# Patient Record
Sex: Female | Born: 1965 | Race: White | Hispanic: No | Marital: Married | Smoking: Never smoker
Health system: Southern US, Community
[De-identification: ages and names within clinical notes are randomized; demographics above are authoritative.]

## PROBLEM LIST (undated history)

## (undated) DIAGNOSIS — E119 Type 2 diabetes mellitus without complications: Secondary | ICD-10-CM

## (undated) DIAGNOSIS — D649 Anemia, unspecified: Secondary | ICD-10-CM

## (undated) HISTORY — DX: Anemia, unspecified: D64.9

---

## 2014-07-14 ENCOUNTER — Ambulatory Visit: Payer: Self-pay | Admitting: Family Medicine

## 2014-08-11 ENCOUNTER — Ambulatory Visit: Payer: Self-pay | Admitting: Family Medicine

## 2015-06-06 ENCOUNTER — Ambulatory Visit
Admission: EM | Admit: 2015-06-06 | Discharge: 2015-06-06 | Disposition: A | Discharge status: Home / Self Care | Payer: BLUE CROSS/BLUE SHIELD | Attending: Family Medicine | Admitting: Family Medicine

## 2015-06-06 DIAGNOSIS — R05 Cough: Secondary | ICD-10-CM | POA: Diagnosis not present

## 2015-06-06 DIAGNOSIS — J302 Other seasonal allergic rhinitis: Secondary | ICD-10-CM

## 2015-06-06 DIAGNOSIS — R059 Cough, unspecified: Secondary | ICD-10-CM

## 2015-06-06 HISTORY — DX: Type 2 diabetes mellitus without complications: E11.9

## 2015-06-06 MED ORDER — FLUTICASONE PROPIONATE 50 MCG/ACT NA SUSP: 2.0000 | Freq: Every day | NASAL | Status: DC

## 2015-06-06 NOTE — ED Notes (Signed)
Started Thursday with cough which was dry and hacking. Now productive yellow. Cough worse at night

## 2015-06-06 NOTE — Discharge Instructions (Signed)
° °  Let's try  A courseof Loratadine ( Claritin ) 1 tablet daily Fluticasone ( Flonase) 1 spray per nostril per day Increase fluids -water  by mouth plus steamy showers/tub baths  Robitussin DM (Guaifenesin DM) 1 tsp every 4 hours   Allergic Rhinitis Allergic rhinitis is when the mucous membranes in the nose respond to allergens. Allergens are particles in the air that cause your body to have an allergic reaction. This causes you to release allergic antibodies. Through a chain of events, these eventually cause you to release histamine into the blood stream. Although meant to protect the body, it is this release of histamine that causes your discomfort, such as frequent sneezing, congestion, and an itchy, runny nose.  CAUSES  Seasonal allergic rhinitis (hay fever) is caused by pollen allergens that may come from grasses, trees, and weeds. Year-round allergic rhinitis (perennial allergic rhinitis) is caused by allergens such as house dust mites, pet dander, and mold spores.  SYMPTOMS   Nasal stuffiness (congestion).  Itchy, runny nose with sneezing and tearing of the eyes. DIAGNOSIS  Your health care provider can help you determine the allergen or allergens that trigger your symptoms. If you and your health care provider are unable to determine the allergen, skin or blood testing may be used. TREATMENT  Allergic rhinitis does not have a cure, but it can be controlled by:  Medicines and allergy shots (immunotherapy).  Avoiding the allergen. Hay fever may often be treated with antihistamines in pill or nasal spray forms. Antihistamines block the effects of histamine. There are over-the-counter medicines that may help with nasal congestion and swelling around the eyes. Check with your health care provider before taking or giving this medicine.  If avoiding the allergen or the medicine prescribed do not work, there are many new medicines your health care provider can prescribe. Stronger medicine  may be used if initial measures are ineffective. Desensitizing injections can be used if medicine and avoidance does not work. Desensitization is when a patient is given ongoing shots until the body becomes less sensitive to the allergen. Make sure you follow up with your health care provider if problems continue. HOME CARE INSTRUCTIONS It is not possible to completely avoid allergens, but you can reduce your symptoms by taking steps to limit your exposure to them. It helps to know exactly what you are allergic to so that you can avoid your specific triggers. SEEK MEDICAL CARE IF:   You have a fever.  You develop a cough that does not stop easily (persistent).  You have shortness of breath.  You start wheezing.  Symptoms interfere with normal daily activities. Document Released: 07/16/2001 Document Revised: 10/26/2013 Document Reviewed: 06/28/2013 [facility] Patient Information 2015 [place], [place]. This information is not intended to replace advice given to you by your health care provider. Make sure you discuss any questions you have with your health care provider.

## 2015-06-06 NOTE — ED Provider Notes (Signed)
CSN: [id number]     Arrival date & time 06/06/15  1912 History   First MD Initiated Contact with Patient 06/06/15 1938     Chief Complaint  Patient presents with  . Cough   (Consider location/radiation/quality/duration/timing/severity/associated sxs/prior Treatment) HPI 56 F with a few days of shallow non-productive cough. Has had weeks of post nasal drip,  mucous congestion at bedtime with small amount clear mucous in cough. Has itchy ears. Afebrile.  All symptoms have increased in the past 6 days.- there has been a recent increase grass pollen.  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 New wallcoverings and carpets  Past Medical History  Diagnosis Date  . Diabetes mellitus without complication    Past Surgical History  Procedure Laterality Date  . Cesarean section      1993   Family History  Problem Relation Age of Onset  . Cancer Father    History  Substance Use Topics  . Smoking status: Never Smoker   . Smokeless tobacco: Not on file  . Alcohol Use: No   OB History    No data available     Review of Systems   Constitutional -afebrile Eyes-denies visual changes ENT- normal voice,denies sore throat, shallow cough CV-denies chest pain Resp-denies SOB GI- negative for nausea,vomiting, diarrhea GU- negative for dysuria MSK- negative for back pain, ambulatory Skin- denies acute changes Neuro- negative headache,focal weakness or numbness    Allergies  Sulfa antibiotics  Home Medications   Prior to Admission medications   Medication Sig Start Date End Date Taking? Authorizing Provider  atorvastatin (LIPITOR) 40 MG tablet Take 40 mg by mouth daily.   Yes Historical Provider, MD  glimepiride (AMARYL) 4 MG tablet Take 8 mg by mouth daily with breakfast.   Yes Historical Provider, MD  hydrochlorothiazide (HYDRODIURIL) 25 MG tablet Take 25 mg by mouth daily.   Yes Historical Provider, MD  lisinopril (PRINIVIL,ZESTRIL)  2.5 MG tablet Take 2.5 mg by mouth daily.   Yes Historical Provider, MD  sitaGLIPtin-metformin (JANUMET) 50-1000 MG per tablet Take 1 tablet by mouth 2 (two) times daily with a meal.   Yes Historical Provider, MD  fluticasone (FLONASE) 50 MCG/ACT nasal spray Place 2 sprays into both nostrils daily. 06/06/15   Rae Halsted, PA-C   BP 123/61 mmHg  Pulse 98  Temp(Src) 98.9 F (37.2 C) (Tympanic)  Resp 18  Ht  (1.651 m)  Wt 228 lb (103.42 kg)  BMI 37.94 kg/m2  SpO2 97%  LMP 05/31/2015 (Exact Date) Physical Exam    Constitutional -alert and oriented,well appearing and in no acute distress, afebrile Head-atraumatic, normocephalic Eyes- conjunctiva normal, EOMI ,conjugate gaze Nose- mild  nasal congestion , no sinus pressure, no eustachian tube dysfunction Mouth/throat- mucous membranes moist ,oropharynx non-erythematous Neck- supple without glandular enlargement CV- regular rate, grossly normal heart sounds,  Resp-no distress, normal respiratory effort,clear to auscultation bilaterally GI- s,no distention GU- u not examined MSK- no tender, normal ROM, all extremities, ambulatory, self-care Neuro- normal speech and language,  Skin-warm,dry ,intact; no rash noted Psych-mood and affect grossly normal; speech and behavior grossly normal  ED Course  Procedures (including critical care time) Labs Review Labs Reviewed - No data to display  Imaging Review No results found.   MDM   1. Cough   2. Other seasonal allergic rhinitis   Plan: 1.  Diagnosis reviewed with patient-seasonal allergies suspected 2. Rx Claritin, fluticasone ; risks,  benefits, potential side effects reviewed with patient 3. Recommend supportive treatment with OTC guafenesin DM/tylenol/ibuprofen/fluids 4. F/u prn if symptoms worsen or don't improve; viral syndrome can be similar.  Onset of fever, malaise, deep cough with production needs to be responded to an re-evaluated Questions fielded, expectations and  recommendations reviewed.  Patient expresses understanding. Will return to [facility] with questions, concern or exacerbation.       Rae Halsted, PA-C 06/06/15 2037

## 2016-08-23 ENCOUNTER — Other Ambulatory Visit: Payer: Self-pay | Admitting: Family Medicine

## 2016-08-23 DIAGNOSIS — R928 Other abnormal and inconclusive findings on diagnostic imaging of breast: Secondary | ICD-10-CM

## 2016-08-27 ENCOUNTER — Other Ambulatory Visit: Payer: Self-pay | Admitting: Family Medicine

## 2016-08-27 DIAGNOSIS — R928 Other abnormal and inconclusive findings on diagnostic imaging of breast: Secondary | ICD-10-CM

## 2016-09-16 ENCOUNTER — Ambulatory Visit
Admission: RE | Admit: 2016-09-16 | Discharge: 2016-09-16 | Disposition: A | Discharge status: Home / Self Care | Payer: 59 | Source: Ambulatory Visit | Attending: Family Medicine | Admitting: Family Medicine

## 2016-09-16 DIAGNOSIS — R928 Other abnormal and inconclusive findings on diagnostic imaging of breast: Secondary | ICD-10-CM | POA: Diagnosis present

## 2016-11-30 ENCOUNTER — Encounter: Payer: Self-pay | Admitting: Emergency Medicine

## 2016-11-30 ENCOUNTER — Ambulatory Visit
Admission: EM | Admit: 2016-11-30 | Discharge: 2016-11-30 | Disposition: A | Discharge status: Home / Self Care | Payer: 59 | Attending: Family Medicine | Admitting: Family Medicine

## 2016-11-30 DIAGNOSIS — N3001 Acute cystitis with hematuria: Secondary | ICD-10-CM

## 2016-11-30 LAB — URINALYSIS, COMPLETE (UACMP) WITH MICROSCOPIC
Bilirubin Urine: NEGATIVE
GLUCOSE, UA: 500 mg/dL — AB
KETONES UR: 15 mg/dL — AB
Nitrite: NEGATIVE
PROTEIN: 100 mg/dL — AB
Specific Gravity, Urine: 1.015 (ref 1.005–1.030)
pH: 5 (ref 5.0–8.0)

## 2016-11-30 MED ORDER — FLUCONAZOLE 150 MG PO TABS: 150.0000 mg | ORAL_TABLET | Freq: Every day | ORAL | 0 refills | Status: AC

## 2016-11-30 MED ORDER — NITROFURANTOIN MONOHYD MACRO 100 MG PO CAPS: 100.0000 mg | ORAL_CAPSULE | Freq: Two times a day (BID) | ORAL | 0 refills | Status: DC

## 2016-11-30 NOTE — ED Triage Notes (Signed)
Patient c/o burning when urinating and increase in urinary frequency for a week.  

## 2016-11-30 NOTE — ED Provider Notes (Signed)
[facility]    CSN: [id number] Arrival date & time: 11/30/16  5784     History   Chief Complaint Chief Complaint  Patient presents with  . Dysuria    HPI Allison Glover is a 51 y.o. female.    Dysuria  Pain quality:  Burning Onset quality:  Sudden Duration:  6 days Timing:  Constant Progression:  Worsening Chronicity:  New Recent urinary tract infections: no   Relieved by:  Nothing Ineffective treatments:  Cranberry juice Urinary symptoms: discolored urine, frequent urination and hematuria   Associated symptoms: no abdominal pain, no fever, no flank pain, no nausea, no vaginal discharge and no vomiting   Risk factors: no hx of pyelonephritis, no hx of urolithiasis, no kidney transplant, not pregnant, no recurrent urinary tract infections, no renal cysts, no renal disease, no single kidney and no urinary catheter     Past Medical History:  Diagnosis Date  . Diabetes mellitus without complication (HCC)     There are no active problems to display for this patient.   Past Surgical History:  Procedure Laterality Date  . CESAREAN SECTION     1993    OB History    No data available       Home Medications    Prior to Admission medications   Medication Sig Start Date End Date Taking? Authorizing Provider  empagliflozin (JARDIANCE) 25 MG TABS tablet Take 25 mg by mouth daily.   Yes Historical Provider, MD  metFORMIN (GLUMETZA) 500 MG (MOD) 24 hr tablet Take 500 mg by mouth daily with breakfast.   Yes Historical Provider, MD  atorvastatin (LIPITOR) 40 MG tablet Take 40 mg by mouth daily.    Historical Provider, MD  fluconazole (DIFLUCAN) 150 MG tablet Take 1 tablet (150 mg total) by mouth daily. 11/30/16 12/01/16  Payton Mccallum, MD  fluticasone (FLONASE) 50 MCG/ACT nasal spray Place 2 sprays into both nostrils daily. 06/06/15   Rae Halsted, PA-C  glimepiride (AMARYL) 4 MG tablet Take 8 mg by mouth daily with breakfast.    Historical Provider, MD    lisinopril (PRINIVIL,ZESTRIL) 2.5 MG tablet Take 2.5 mg by mouth daily.    Historical Provider, MD  nitrofurantoin, macrocrystal-monohydrate, (MACROBID) 100 MG capsule Take 1 capsule (100 mg total) by mouth 2 (two) times daily. 11/30/16   Payton Mccallum, MD    Family History Family History  Problem Relation Age of Onset  . Cancer Father     Social History Social History  Substance Use Topics  . Smoking status: Never Smoker  . Smokeless tobacco: Never Used  . Alcohol use No     Allergies   Sulfa antibiotics   Review of Systems Review of Systems  Constitutional: Negative for fever.  Gastrointestinal: Negative for abdominal pain, nausea and vomiting.  Genitourinary: Positive for dysuria. Negative for flank pain and vaginal discharge.     Physical Exam Triage Vital Signs ED Triage Vitals  Enc Vitals Group     BP 11/30/16 0845 110/70     Pulse Rate 11/30/16 0845 99     Resp 11/30/16 0845 16     Temp 11/30/16 0845 98.9 F (37.2 C)     Temp Source 11/30/16 0845 Oral     SpO2 11/30/16 0845 100 %     Weight 11/30/16 0843 222 lb (100.7 kg)     Height 11/30/16 0843 5\' 4"  (1.626 m)     Head Circumference --      Peak Flow --  Pain Score 11/30/16 0845 5     Pain Loc --      Pain Edu? --      Excl. in GC? --    No data found.   Updated Vital Signs BP 110/70 (BP Location: Right Arm)   Pulse 99   Temp 98.9 F (37.2 C) (Oral)   Resp 16   Ht 5\' 4"  (1.626 m)   Wt 222 lb (100.7 kg)   LMP 11/18/2016 (Exact Date)   SpO2 100%   BMI 38.11 kg/m   Visual Acuity Right Eye Distance:   Left Eye Distance:   Bilateral Distance:    Right Eye Near:   Left Eye Near:    Bilateral Near:     Physical Exam  Constitutional: She appears well-developed and well-nourished. No distress.  Abdominal: Soft. Bowel sounds are normal. She exhibits no distension and no mass. There is no tenderness. There is no rebound and no guarding.  Skin: She is not diaphoretic.  Nursing note and  vitals reviewed.    UC Treatments / Results  Labs (all labs ordered are listed, but only abnormal results are displayed) Labs Reviewed  URINALYSIS, COMPLETE (UACMP) WITH MICROSCOPIC - Abnormal; Notable for the following:       Result Value   Color, Urine BROWN (*)    APPearance CLOUDY (*)    Glucose, UA 500 (*)    Hgb urine dipstick LARGE (*)    Ketones, ur 15 (*)    Protein, ur 100 (*)    Leukocytes, UA SMALL (*)    Squamous Epithelial / LPF 0-5 (*)    Bacteria, UA FEW (*)    All other components within normal limits  URINE CULTURE    EKG  EKG Interpretation None       Radiology No results found.  Procedures Procedures (including critical care time)  Medications Ordered in UC Medications - No data to display   Initial Impression / Assessment and Plan / UC Course  I have reviewed the triage vital signs and the nursing notes.  Pertinent labs & imaging results that were available during my care of the patient were reviewed by me and considered in my medical decision making (see chart for details).       Final Clinical Impressions(s) / UC Diagnoses   Final diagnoses:  Acute cystitis with hematuria    New Prescriptions Discharge Medication List as of 11/30/2016  9:06 AM    START taking these medications   Details  fluconazole (DIFLUCAN) 150 MG tablet Take 1 tablet (150 mg total) by mouth daily., Starting Sat 11/30/2016, Until Sun 12/01/2016, Normal    nitrofurantoin, macrocrystal-monohydrate, (MACROBID) 100 MG capsule Take 1 capsule (100 mg total) by mouth 2 (two) times daily., Starting Sat 11/30/2016, Normal       1. Lab results and diagnosis reviewed with patient 2. rx as per orders above; reviewed possible side effects, interactions, risks and benefits  3. Recommend supportive treatment with increased water intake 4. Follow-up prn if symptoms worsen or don't improve   Payton Mccallumrlando Angelize Ryce, MD 11/30/16 [phone]

## 2016-12-03 LAB — URINE CULTURE: Culture: >=100000 — AB

## 2019-09-03 ENCOUNTER — Other Ambulatory Visit: Payer: Self-pay | Admitting: Family Medicine

## 2019-09-03 DIAGNOSIS — Z1231 Encounter for screening mammogram for malignant neoplasm of breast: Secondary | ICD-10-CM

## 2019-12-21 ENCOUNTER — Ambulatory Visit
Admission: RE | Admit: 2019-12-21 | Discharge: 2019-12-21 | Disposition: A | Discharge status: Home / Self Care | Payer: 59 | Source: Ambulatory Visit | Attending: Family Medicine | Admitting: Family Medicine

## 2019-12-21 DIAGNOSIS — Z1231 Encounter for screening mammogram for malignant neoplasm of breast: Secondary | ICD-10-CM | POA: Diagnosis present

## 2020-12-25 IMAGING — MG DIGITAL SCREENING BILAT W/ TOMO W/ CAD
6 of 12 series · 6 of 36 positions shown · non-contrast
Comparison: Previous exam(s).

CLINICAL DATA: Screening.

EXAM:
DIGITAL SCREENING BILATERAL MAMMOGRAM WITH TOMO AND CAD

[L CC synth-2D (1 of 2)]
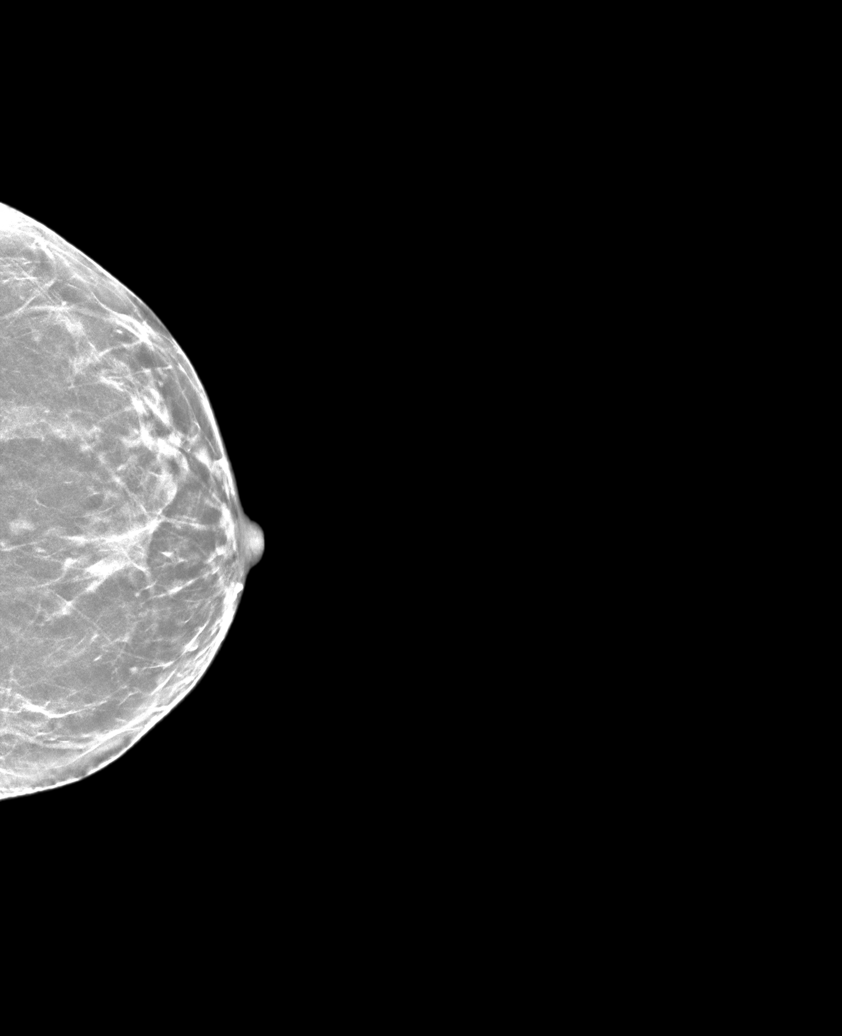

[R MLO synth-2D]
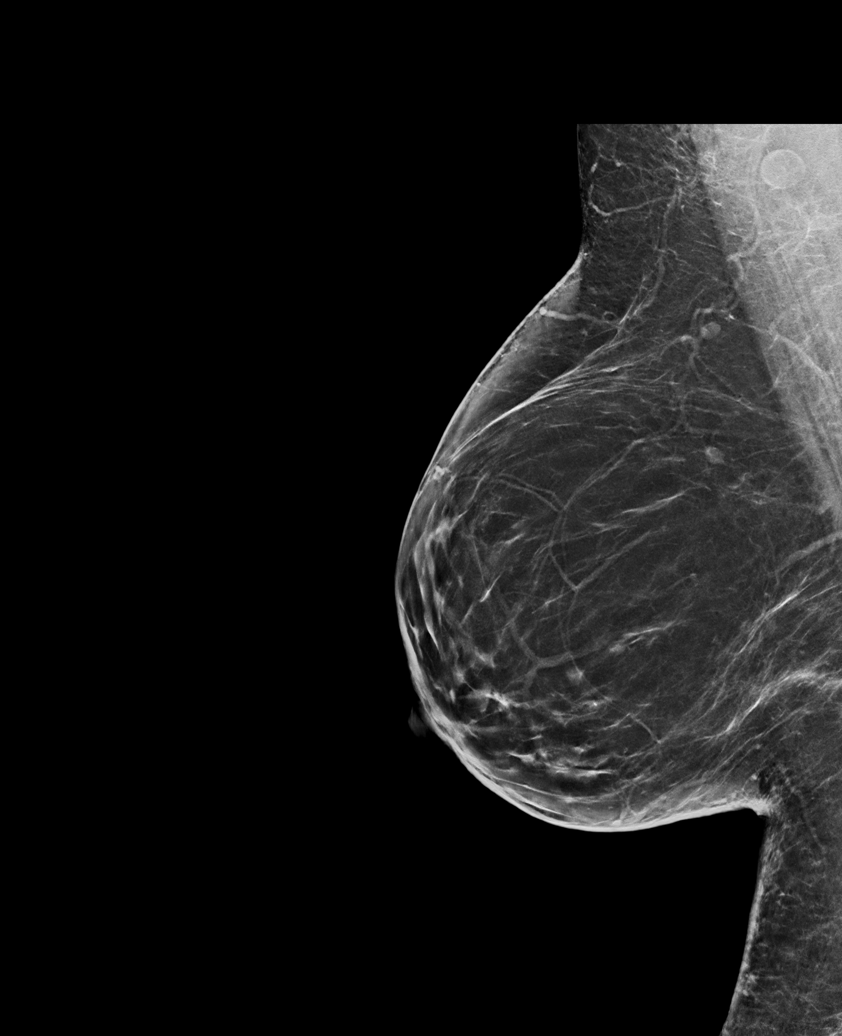

[R CC synth-2D (1 of 2)]
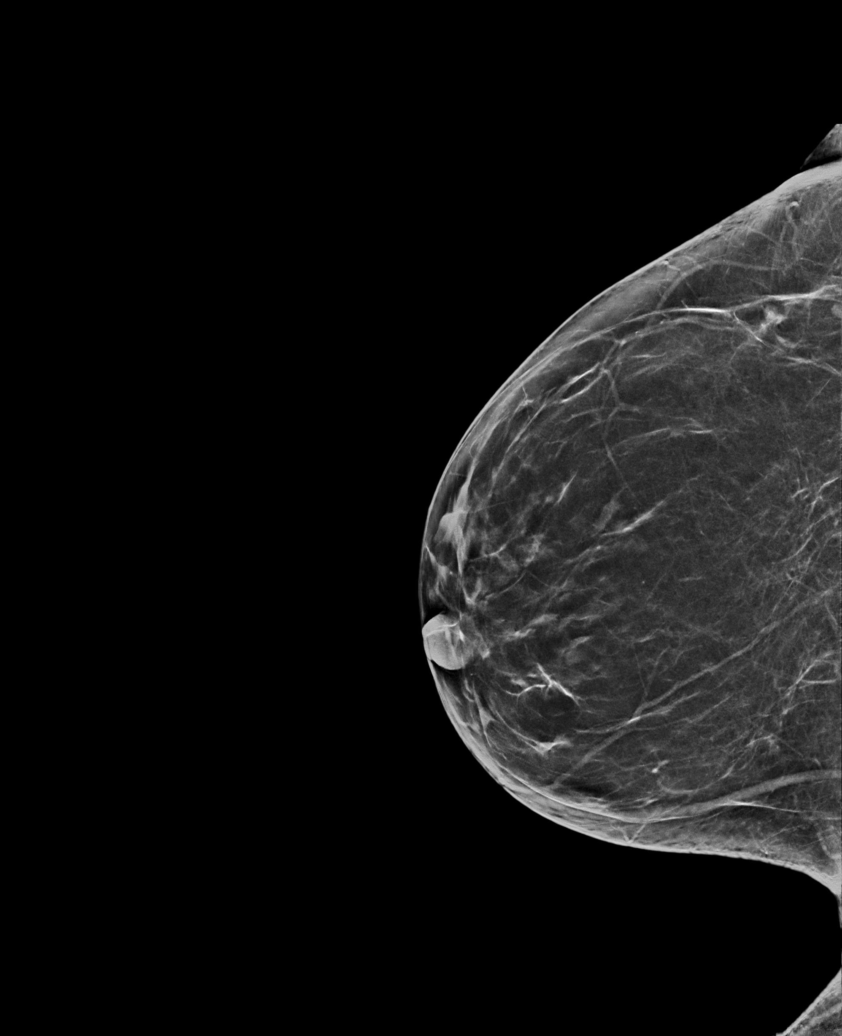

[L CC synth-2D (2 of 2)]
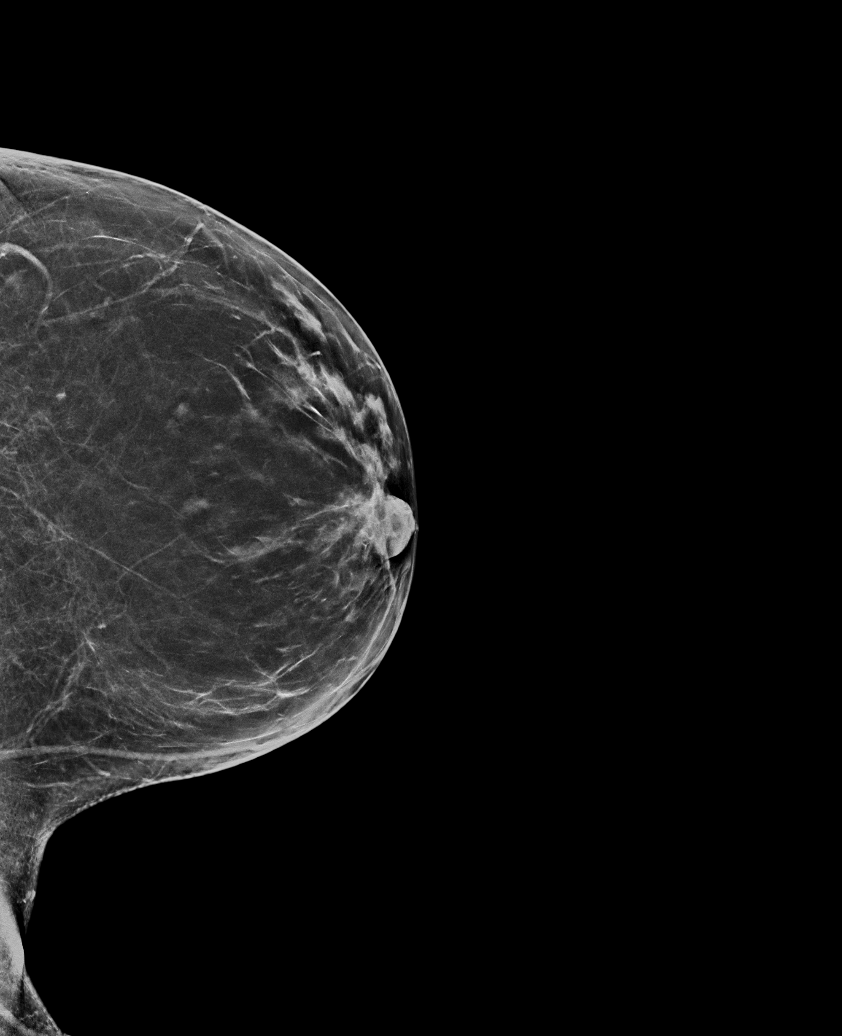

[R CC synth-2D (2 of 2)]
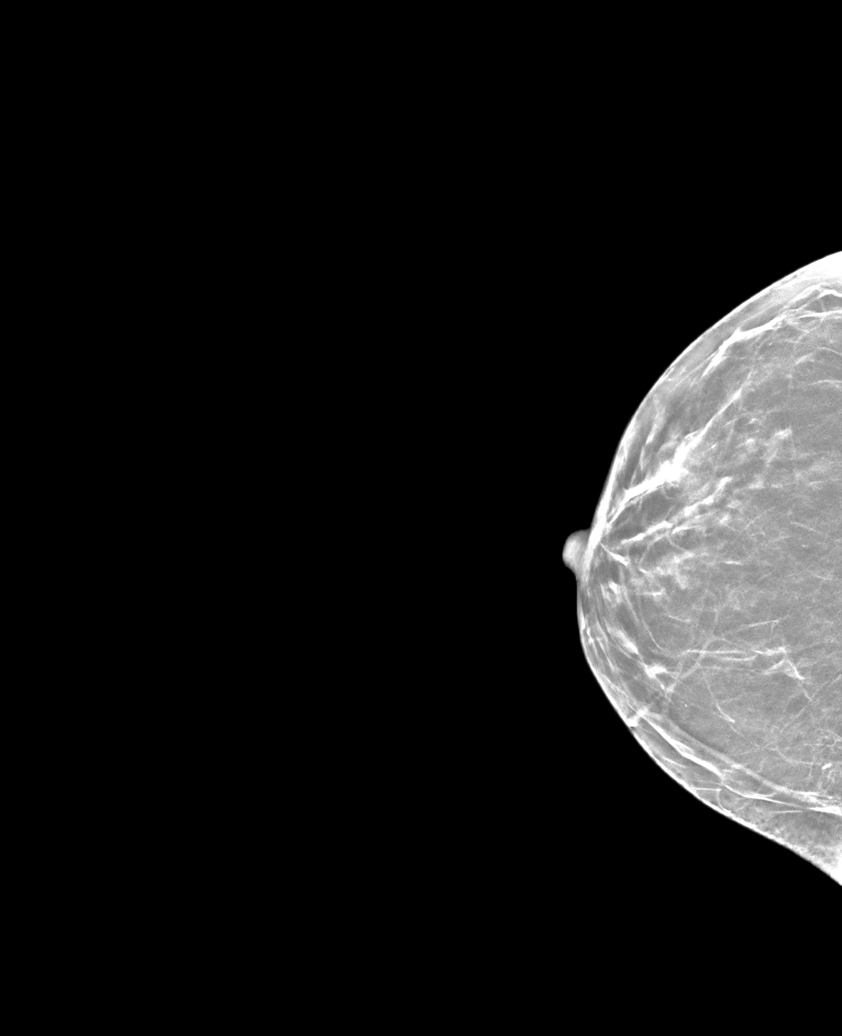

[L MLO synth-2D]
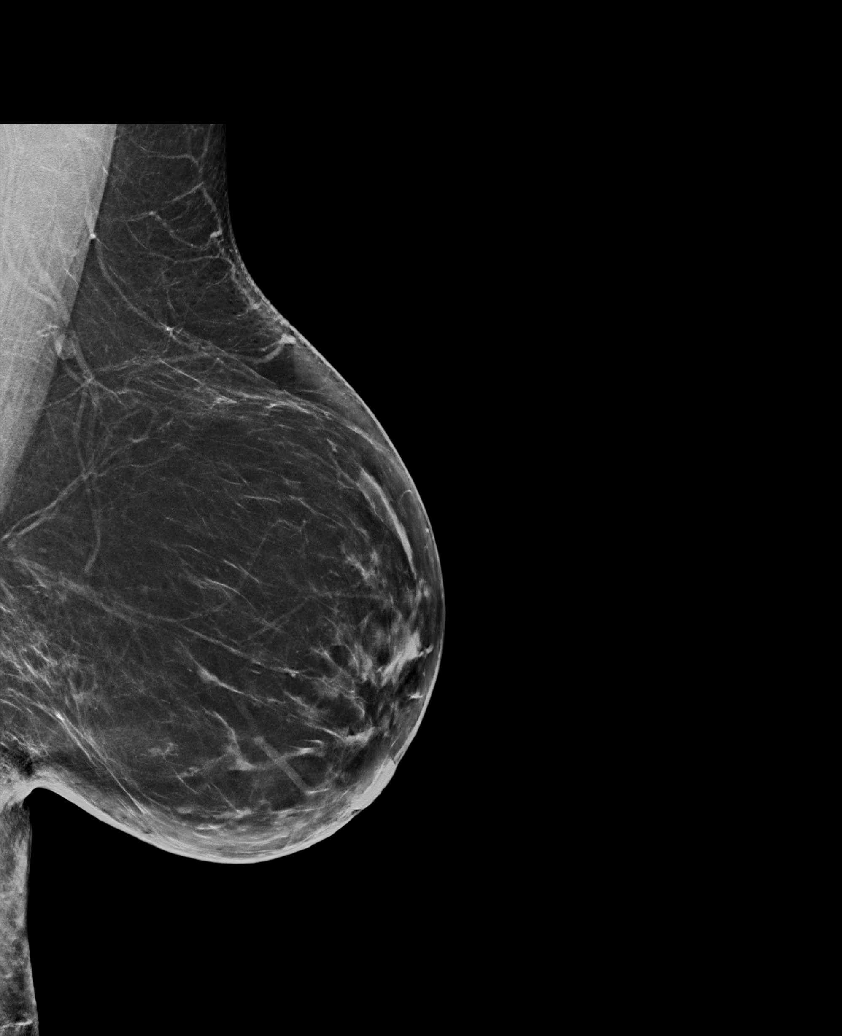

[6 of 36 positions shown; findings below may reference images not displayed]

ACR Breast Density Category b: There are scattered areas of
fibroglandular density.
FINDINGS: There are no findings suspicious for malignancy. Images were
processed with CAD.
IMPRESSION: No mammographic evidence of malignancy. A result letter of this
screening mammogram will be mailed directly to the patient.

RECOMMENDATION:
Screening mammogram in one year. (Code:[id number])

BI-RADS CATEGORY  1: Negative.

## 2022-01-28 LAB — COLOGUARD: COLOGUARD: POSITIVE — AB

## 2022-09-03 ENCOUNTER — Other Ambulatory Visit: Payer: Self-pay | Admitting: Family Medicine

## 2022-09-03 DIAGNOSIS — Z1231 Encounter for screening mammogram for malignant neoplasm of breast: Secondary | ICD-10-CM

## 2022-12-02 ENCOUNTER — Ambulatory Visit
Admission: RE | Admit: 2022-12-02 | Discharge: 2022-12-02 | Disposition: A | Discharge status: Home / Self Care | Payer: 59 | Source: Ambulatory Visit | Attending: Family Medicine | Admitting: Family Medicine

## 2022-12-02 DIAGNOSIS — Z1231 Encounter for screening mammogram for malignant neoplasm of breast: Secondary | ICD-10-CM | POA: Diagnosis present

## 2024-05-12 ENCOUNTER — Inpatient Hospital Stay

## 2024-05-12 ENCOUNTER — Encounter: Payer: Self-pay | Admitting: Internal Medicine

## 2024-05-12 ENCOUNTER — Inpatient Hospital Stay: Attending: Internal Medicine | Admitting: Internal Medicine

## 2024-05-12 VITALS — BP 123/83 | HR 96 | Temp 97.2°F | Resp 20 | Wt 192.1 lb

## 2024-05-12 DIAGNOSIS — D721 Eosinophilia, unspecified: Secondary | ICD-10-CM | POA: Insufficient documentation

## 2024-05-12 LAB — COMPREHENSIVE METABOLIC PANEL WITH GFR
ALT: 21 U/L (ref 0–44)
AST: 22 U/L (ref 15–41)
Albumin: 4.3 g/dL (ref 3.5–5.0)
Alkaline Phosphatase: 67 U/L (ref 38–126)
Anion gap: 9 (ref 5–15)
BUN: 16 mg/dL (ref 6–20)
CO2: 22 mmol/L (ref 22–32)
Calcium: 9.3 mg/dL (ref 8.9–10.3)
Chloride: 105 mmol/L (ref 98–111)
Creatinine, Ser: 0.78 mg/dL (ref 0.44–1.00)
GFR, Estimated: >60 mL/min (ref >60)
Glucose, Bld: 184 mg/dL — ABNORMAL HIGH (ref 70–99)
Potassium: 3.7 mmol/L (ref 3.5–5.1)
Sodium: 136 mmol/L (ref 135–145)
Total Bilirubin: 0.8 mg/dL (ref 0.0–1.2)
Total Protein: 7.6 g/dL (ref 6.5–8.1)

## 2024-05-12 LAB — TECHNOLOGIST SMEAR REVIEW: Plt Morphology: ADEQUATE

## 2024-05-12 LAB — CBC WITH DIFFERENTIAL/PLATELET
Abs Immature Granulocytes: 0.03 K/uL (ref 0.00–0.07)
Basophils Absolute: 0.1 K/uL (ref 0.0–0.1)
Basophils Relative: 1 %
Eosinophils Absolute: 0.6 K/uL — ABNORMAL HIGH (ref 0.0–0.5)
Eosinophils Relative: 7 %
HCT: 41.8 % (ref 36.0–46.0)
Hemoglobin: 14.3 g/dL (ref 12.0–15.0)
Immature Granulocytes: 0 %
Lymphocytes Relative: 39 %
Lymphs Abs: 3.3 K/uL (ref 0.7–4.0)
MCH: 31 pg (ref 26.0–34.0)
MCHC: 34.2 g/dL (ref 30.0–36.0)
MCV: 90.7 fL (ref 80.0–100.0)
Monocytes Absolute: 0.5 K/uL (ref 0.1–1.0)
Monocytes Relative: 6 %
Neutro Abs: 3.9 K/uL (ref 1.7–7.7)
Neutrophils Relative %: 47 %
Platelets: 310 K/uL (ref 150–400)
RBC: 4.61 MIL/uL (ref 3.87–5.11)
RDW: 12.9 % (ref 11.5–15.5)
WBC: 8.4 K/uL (ref 4.0–10.5)
nRBC: 0 % (ref 0.0–0.2)

## 2024-05-12 LAB — VITAMIN B12: Vitamin B-12: 349 pg/mL (ref 180–914)

## 2024-05-12 LAB — C-REACTIVE PROTEIN: CRP: 1.7 mg/dL — ABNORMAL HIGH (ref <1.0)

## 2024-05-12 LAB — LACTATE DEHYDROGENASE: LDH: 97 U/L — ABNORMAL LOW (ref 98–192)

## 2024-05-12 NOTE — Assessment & Plan Note (Addendum)
#   Elevated white count/-predominant eosinophilia. Patient fairly asymptomatic from underlying blood work.  # I I had a long discussion with the patient regarding the multiple etiologies of elevated white count/eosinophilia including but not limited to reactive causes like allergies; parasitic infection; inflammation/autoimmune conditions, medications etc.  However other etiologies include malignancy/leukemia etc.   # Recommend CBC CMP LDH; peripheral blood flow cytometry; review of smear;b12;  tryptase level; CRP; FISH panel for HES testing.  Discussed the possible need for a bone marrow biopsy for further evaluation.  However hold off a bone marrow biopsy at this time pending above testing.  Also hold any further testing-like troponin/cardiac MRI; HRCT chest  Thank you Dr. Zachary for allowing me to participate in the care of your pleasant patient. Please do not hesitate to contact me with questions or concerns in the interim.  # DISPOSITION: # Labs today- # Follow-up in the next 2 to 3 weeks-MD no labs- Dr.B

## 2024-05-12 NOTE — Progress Notes (Signed)
 [facility] OFFICE PROGRESS NOTE  Patient Care Team: Rojelio Loader, MD as PCP - General (Family Medicine) Rennie Cindy SAUNDERS, MD as Consulting Physician (Oncology)   # HEMATOLOGY HISTORY:  # LEUCOCYTOSIS- WBC-; N; L; Hb- platelets   Oncology History   No history exists.   Patient ambulating-independently.  Alone    Allison Glover 58 y.o.  female pleasant patient history of diabetes-and- noted to worsening elevated white count is here for initial consultation/workup.  Patient denies any shortness of breath or cough.  Denies any frequent allergies.  Denies abdominal pain nausea vomiting weight loss.  Denies any skin rash or any joint pains.  Night sweats: none Weight loss: on ozempic   Infections:NONE Splenectomy: NONE Medications: NSAIDs/allopurinol/Macrobid  penicillin/dress syndrome: NONE  Patient denies any symptoms of systemic mastocytosis (eg, urticaria, anaphylaxis, flushing, or abdominal symptoms).   Denies any unusual skin rashes, unusual shortness of breath cough suggestive of any systemic autoimmune condition.  # Elevated white count/-predominant eosinophilia. Patient fairly asymptomatic from underlying blood work.  # I I had a long discussion with the patient regarding the multiple etiologies of elevated white count/eosinophilia including but not limited to reactive causes like allergies; parasitic infection; inflammation/autoimmune conditions, medications etc.  However other etiologies include malignancy/leukemia etc.   # Recommend CBC CMP LDH; peripheral blood flow cytometry; review of smear;b12;  tryptase level; CRP; FISH panel for HES testing.  Discussed the possible need for a bone marrow biopsy for further evaluation.  However hold off a bone marrow biopsy at this time pending above testing.  Also hold any further testing-like troponin/cardiac MRI; HRCT chest  Thank you Dr. Zachary for allowing me to participate in the care of your  pleasant patient. Please do not hesitate to contact me with questions or concerns in the interim.  # DISPOSITION: # Labs today- # Follow-up in the next 2 to 3 weeks-MD no labs- Dr.B   Review of Systems  Constitutional:  Positive for malaise/fatigue. Negative for chills, diaphoresis, fever and weight loss.  HENT:  Negative for nosebleeds and sore throat.   Eyes:  Negative for double vision.  Respiratory:  Negative for cough, hemoptysis, sputum production, shortness of breath and wheezing.   Cardiovascular:  Negative for chest pain, palpitations, orthopnea and leg swelling.  Gastrointestinal:  Negative for abdominal pain, blood in stool, constipation, diarrhea, heartburn, melena, nausea and vomiting.  Genitourinary:  Negative for dysuria, frequency and urgency.  Musculoskeletal:  Negative for back pain and joint pain.  Skin: Negative.  Negative for itching and rash.  Neurological:  Negative for dizziness, tingling, focal weakness, weakness and headaches.  Endo/Heme/Allergies:  Does not bruise/bleed easily.  Psychiatric/Behavioral:  Negative for depression. The patient is not nervous/anxious and does not have insomnia.       PAST MEDICAL HISTORY :  Past Medical History:  Diagnosis Date   Anemia    Diabetes mellitus without complication (HCC)     PAST SURGICAL HISTORY :   Past Surgical History:  Procedure Laterality Date   CESAREAN SECTION     1993   CESAREAN SECTION      FAMILY HISTORY :   Family History  Problem Relation Age of Onset   Cancer Father     SOCIAL HISTORY:   Social History   Tobacco Use   Smoking status: Never   Smokeless tobacco: Never  Substance Use Topics   Alcohol use: No   Drug use: Never    ALLERGIES:  is allergic to sulfa antibiotics.  MEDICATIONS:  Current Outpatient Medications  Medication Sig Dispense Refill   Cholecalciferol 125 MCG (5000 UT) TABS Take 1 tablet by mouth daily.     empagliflozin (JARDIANCE) 25 MG TABS tablet Take 25 mg  by mouth daily.     glipiZIDE (GLUCOTROL XL) 5 MG 24 hr tablet Take 5 mg by mouth 2 (two) times daily.     lisinopril (PRINIVIL,ZESTRIL) 2.5 MG tablet Take 2.5 mg by mouth daily.     pravastatin (PRAVACHOL) 10 MG tablet Take 10 mg by mouth daily.     Semaglutide, 2 MG/DOSE, (OZEMPIC, 2 MG/DOSE,) 8 MG/3ML SOPN Inject 2 mg into the skin once a week.     atorvastatin (LIPITOR) 40 MG tablet Take 40 mg by mouth daily.     fluticasone  (FLONASE ) 50 MCG/ACT nasal spray Place 2 sprays into both nostrils daily. 16 g 2   glimepiride (AMARYL) 4 MG tablet Take 8 mg by mouth daily with breakfast.     nitrofurantoin , macrocrystal-monohydrate, (MACROBID ) 100 MG capsule Take 1 capsule (100 mg total) by mouth 2 (two) times daily. 14 capsule 0   No current facility-administered medications for this visit.    PHYSICAL EXAMINATION:  BP 123/83 (BP Location: Left Arm, Patient Position: Sitting, Cuff Size: Normal)   Pulse 96   Temp (!) 97.2 F (36.2 C) (Tympanic)   Resp 20   Wt 192 lb 1.6 oz (87.1 kg)   SpO2 99%   BMI 32.97 kg/m   Filed Weights   05/12/24 1355  Weight: 192 lb 1.6 oz (87.1 kg)    Physical Exam Vitals and nursing note reviewed.  HENT:     Head: Normocephalic and atraumatic.     Mouth/Throat:     Pharynx: Oropharynx is clear.  Eyes:     Extraocular Movements: Extraocular movements intact.     Pupils: Pupils are equal, round, and reactive to light.  Cardiovascular:     Rate and Rhythm: Normal rate and regular rhythm.  Pulmonary:     Comments: Decreased breath sounds bilaterally.  Abdominal:     Palpations: Abdomen is soft.  Musculoskeletal:        General: Normal range of motion.     Cervical back: Normal range of motion.  Skin:    General: Skin is warm.  Neurological:     General: No focal deficit present.     Mental Status: She is alert and oriented to person, place, and time.  Psychiatric:        Behavior: Behavior normal.        Judgment: Judgment normal.         LABORATORY DATA:  I have reviewed the data as listed    Component Value Date/Time   NA 136 05/12/2024 1456   K 3.7 05/12/2024 1456   CL 105 05/12/2024 1456   CO2 22 05/12/2024 1456   GLUCOSE 184 (H) 05/12/2024 1456   BUN 16 05/12/2024 1456   CREATININE 0.78 05/12/2024 1456   CALCIUM 9.3 05/12/2024 1456   PROT 7.6 05/12/2024 1456   ALBUMIN 4.3 05/12/2024 1456   AST 22 05/12/2024 1456   ALT 21 05/12/2024 1456   ALKPHOS 67 05/12/2024 1456   BILITOT 0.8 05/12/2024 1456   GFRNONAA >60 05/12/2024 1456    No results found for: SPEP, UPEP  Lab Results  Component Value Date   WBC 8.4 05/12/2024   NEUTROABS 3.9 05/12/2024   HGB 14.3 05/12/2024   HCT 41.8 05/12/2024   MCV 90.7 05/12/2024   PLT  310 05/12/2024      Chemistry      Component Value Date/Time   NA 136 05/12/2024 1456   K 3.7 05/12/2024 1456   CL 105 05/12/2024 1456   CO2 22 05/12/2024 1456   BUN 16 05/12/2024 1456   CREATININE 0.78 05/12/2024 1456      Component Value Date/Time   CALCIUM 9.3 05/12/2024 1456   ALKPHOS 67 05/12/2024 1456   AST 22 05/12/2024 1456   ALT 21 05/12/2024 1456   BILITOT 0.8 05/12/2024 1456       RADIOGRAPHIC STUDIES: I have personally reviewed the radiological images as listed and agreed with the findings in the report. No results found.   ASSESSMENT & PLAN:  Acquired eosinophilia # Elevated white count/-predominant eosinophilia. Patient fairly asymptomatic from underlying blood work.  # I I had a long discussion with the patient regarding the multiple etiologies of elevated white count/eosinophilia including but not limited to reactive causes like allergies; parasitic infection; inflammation/autoimmune conditions, medications etc.  However other etiologies include malignancy/leukemia etc.   # Recommend CBC CMP LDH; peripheral blood flow cytometry; review of smear;b12;  tryptase level; CRP; FISH panel for HES testing.  Discussed the possible need for a bone  marrow biopsy for further evaluation.  However hold off a bone marrow biopsy at this time pending above testing.  Also hold any further testing-like troponin/cardiac MRI; HRCT chest  Thank you Dr. Zachary for allowing me to participate in the care of your pleasant patient. Please do not hesitate to contact me with questions or concerns in the interim.  # DISPOSITION: # Labs today- # Follow-up in the next 2 to 3 weeks-MD no labs- Dr.B   Orders Placed This Encounter  Procedures   CBC with Differential/Platelet    Standing Status:   Future    Number of Occurrences:   1    Expiration Date:   05/12/2025   Comprehensive metabolic panel with GFR    Standing Status:   Future    Number of Occurrences:   1    Expiration Date:   05/12/2025   Lactate dehydrogenase    Standing Status:   Future    Number of Occurrences:   1    Expiration Date:   05/12/2025   Flow cytometry panel-leukemia/lymphoma work-up    Standing Status:   Future    Number of Occurrences:   1    Expiration Date:   05/12/2025   Miscellaneous [facility] test (send-out)    Standing Status:   Future    Number of Occurrences:   1    Expiration Date:   05/12/2025    Test name / description::   Myeloproliferative Neoplasms With Hypereosinophilia, FISH Test Number [id number]   Tryptase   Vitamin B12    Standing Status:   Future    Number of Occurrences:   1    Expiration Date:   05/12/2025   Technologist smear review    Standing Status:   Future    Number of Occurrences:   1    Expected Date:   05/12/2024    Expiration Date:   05/12/2025    Clinical information::   Anemia   C-reactive protein    Standing Status:   Future    Number of Occurrences:   1    Expiration Date:   05/12/2025   All questions were answered. The patient knows to call the clinic with any problems, questions or concerns.      Jadine Brumley R  Rennie, MD 05/12/2024 3:51 PM

## 2024-05-14 LAB — TRYPTASE: Tryptase: 5.6 ug/L (ref 2.2–13.2)

## 2024-05-17 LAB — COMP PANEL: LEUKEMIA/LYMPHOMA

## 2024-05-21 LAB — MISC LABCORP TEST (SEND OUT): Labcorp test code: 512287

## 2024-06-07 ENCOUNTER — Encounter: Payer: Self-pay | Admitting: Internal Medicine

## 2024-06-07 ENCOUNTER — Inpatient Hospital Stay: Attending: Internal Medicine | Admitting: Internal Medicine

## 2024-06-07 VITALS — BP 118/76 | HR 81 | Temp 96.6°F | Resp 18 | Wt 191.3 lb

## 2024-06-07 DIAGNOSIS — E119 Type 2 diabetes mellitus without complications: Secondary | ICD-10-CM | POA: Diagnosis not present

## 2024-06-07 DIAGNOSIS — D721 Eosinophilia, unspecified: Secondary | ICD-10-CM

## 2024-06-07 DIAGNOSIS — D72829 Elevated white blood cell count, unspecified: Secondary | ICD-10-CM | POA: Diagnosis present

## 2024-06-07 NOTE — Progress Notes (Signed)
 Patient has no concerns

## 2024-06-07 NOTE — Assessment & Plan Note (Addendum)
#   Elevated white count/-predominant eosinophilia. Patient fairly asymptomatic from underlying blood work. JULy 2025- FLOW CYTOMETRY :  lymphocytosis is history.  An increase in CD4+ T-cells, suggestive of a  reactive process.   # mild intermittent eosinophilia: Not concerning malignant process.  For any FISH panel for HES testing- NEGATIVE-I suspectmild allergies versus idiopathic.SABRA  However would not recommend any further workup like a bone marrow biopsy especially as the patient clinically asymptomatic.  # Mild elevation of CRP at 1.7-no obvious signs of infection.  Question related to obesity.  Patient recommend continued weight loss/control of diabetes as per PCP.  # #Since patient is clinically stable I think is reasonable for the patient to follow-up with PCP/can follow-up with us  as needed.  Patient comfortable with the plan; to call us  if any questions or concerns in the interim.   # DISPOSITION: # Follow-up as needed-Dr.B

## 2024-06-07 NOTE — Progress Notes (Signed)
 [facility] OFFICE PROGRESS NOTE  Patient Care Team: Rojelio Loader, MD as PCP - General (Family Medicine) Rennie Cindy SAUNDERS, MD as Consulting Physician (Oncology)   # HEMATOLOGY HISTORY:  # LEUCOCYTOSIS- WBC-; N; L; Hb- platelets   Oncology History   No history exists.   Patient ambulating-independently.  Alone    Allison Glover 58 y.o.  female pleasant patient history of diabetes-and- noted to worsening elevated white count is here for follow-up to review the results of the blood workup.  Patient denies any new complaints. Patient denies any shortness of breath or cough.  Denies any frequent allergies.   Review of Systems  Constitutional:  Positive for malaise/fatigue. Negative for chills, diaphoresis, fever and weight loss.  HENT:  Negative for nosebleeds and sore throat.   Eyes:  Negative for double vision.  Respiratory:  Negative for cough, hemoptysis, sputum production, shortness of breath and wheezing.   Cardiovascular:  Negative for chest pain, palpitations, orthopnea and leg swelling.  Gastrointestinal:  Negative for abdominal pain, blood in stool, constipation, diarrhea, heartburn, melena, nausea and vomiting.  Genitourinary:  Negative for dysuria, frequency and urgency.  Musculoskeletal:  Negative for back pain and joint pain.  Skin: Negative.  Negative for itching and rash.  Neurological:  Negative for dizziness, tingling, focal weakness, weakness and headaches.  Endo/Heme/Allergies:  Does not bruise/bleed easily.  Psychiatric/Behavioral:  Negative for depression. The patient is not nervous/anxious and does not have insomnia.       PAST MEDICAL HISTORY :  Past Medical History:  Diagnosis Date   Anemia    Diabetes mellitus without complication (HCC)     PAST SURGICAL HISTORY :   Past Surgical History:  Procedure Laterality Date   CESAREAN SECTION     1993   CESAREAN SECTION      FAMILY HISTORY :   Family History  Problem  Relation Age of Onset   Cancer Father     SOCIAL HISTORY:   Social History   Tobacco Use   Smoking status: Never   Smokeless tobacco: Never  Substance Use Topics   Alcohol use: No   Drug use: Never    ALLERGIES:  is allergic to atorvastatin, metformin, and sulfa antibiotics.  MEDICATIONS:  Current Outpatient Medications  Medication Sig Dispense Refill   Cholecalciferol 125 MCG (5000 UT) TABS Take 1 tablet by mouth daily.     empagliflozin (JARDIANCE) 25 MG TABS tablet Take 25 mg by mouth daily.     glipiZIDE (GLUCOTROL XL) 5 MG 24 hr tablet Take 5 mg by mouth 2 (two) times daily.     lisinopril (PRINIVIL,ZESTRIL) 2.5 MG tablet Take 2.5 mg by mouth daily.     pravastatin (PRAVACHOL) 10 MG tablet Take 10 mg by mouth daily.     Semaglutide, 2 MG/DOSE, (OZEMPIC, 2 MG/DOSE,) 8 MG/3ML SOPN Inject 2 mg into the skin once a week.     atorvastatin (LIPITOR) 40 MG tablet Take 40 mg by mouth daily.     fluticasone  (FLONASE ) 50 MCG/ACT nasal spray Place 2 sprays into both nostrils daily. 16 g 2   glimepiride (AMARYL) 4 MG tablet Take 8 mg by mouth daily with breakfast.     nitrofurantoin , macrocrystal-monohydrate, (MACROBID ) 100 MG capsule Take 1 capsule (100 mg total) by mouth 2 (two) times daily. 14 capsule 0   No current facility-administered medications for this visit.    PHYSICAL EXAMINATION:  BP 118/76   Pulse 81   Temp (!) 96.6 F (35.9  C)   Resp 18   Wt 191 lb 4.8 oz (86.8 kg)   SpO2 97%   BMI 32.84 kg/m   Filed Weights   06/07/24 1342  Weight: 191 lb 4.8 oz (86.8 kg)     Physical Exam Vitals and nursing note reviewed.  HENT:     Head: Normocephalic and atraumatic.     Mouth/Throat:     Pharynx: Oropharynx is clear.  Eyes:     Extraocular Movements: Extraocular movements intact.     Pupils: Pupils are equal, round, and reactive to light.  Cardiovascular:     Rate and Rhythm: Normal rate and regular rhythm.  Pulmonary:     Comments: Decreased breath sounds  bilaterally.  Abdominal:     Palpations: Abdomen is soft.  Musculoskeletal:        General: Normal range of motion.     Cervical back: Normal range of motion.  Skin:    General: Skin is warm.  Neurological:     General: No focal deficit present.     Mental Status: She is alert and oriented to person, place, and time.  Psychiatric:        Behavior: Behavior normal.        Judgment: Judgment normal.        LABORATORY DATA:  I have reviewed the data as listed    Component Value Date/Time   NA 136 05/12/2024 1456   K 3.7 05/12/2024 1456   CL 105 05/12/2024 1456   CO2 22 05/12/2024 1456   GLUCOSE 184 (H) 05/12/2024 1456   BUN 16 05/12/2024 1456   CREATININE 0.78 05/12/2024 1456   CALCIUM 9.3 05/12/2024 1456   PROT 7.6 05/12/2024 1456   ALBUMIN 4.3 05/12/2024 1456   AST 22 05/12/2024 1456   ALT 21 05/12/2024 1456   ALKPHOS 67 05/12/2024 1456   BILITOT 0.8 05/12/2024 1456   GFRNONAA >60 05/12/2024 1456    No results found for: SPEP, UPEP  Lab Results  Component Value Date   WBC 8.4 05/12/2024   NEUTROABS 3.9 05/12/2024   HGB 14.3 05/12/2024   HCT 41.8 05/12/2024   MCV 90.7 05/12/2024   PLT 310 05/12/2024      Chemistry      Component Value Date/Time   NA 136 05/12/2024 1456   K 3.7 05/12/2024 1456   CL 105 05/12/2024 1456   CO2 22 05/12/2024 1456   BUN 16 05/12/2024 1456   CREATININE 0.78 05/12/2024 1456      Component Value Date/Time   CALCIUM 9.3 05/12/2024 1456   ALKPHOS 67 05/12/2024 1456   AST 22 05/12/2024 1456   ALT 21 05/12/2024 1456   BILITOT 0.8 05/12/2024 1456       RADIOGRAPHIC STUDIES: I have personally reviewed the radiological images as listed and agreed with the findings in the report. No results found.   ASSESSMENT & PLAN:  Acquired eosinophilia # Elevated white count/-predominant eosinophilia. Patient fairly asymptomatic from underlying blood work. JULy 2025- FLOW CYTOMETRY :  lymphocytosis is history.  An increase in CD4+  T-cells, suggestive of a  reactive process.   # mild intermittent eosinophilia: Not concerning malignant process.  For any FISH panel for HES testing- NEGATIVE-I suspectmild allergies versus idiopathic.SABRA  However would not recommend any further workup like a bone marrow biopsy especially as the patient clinically asymptomatic.  # Mild elevation of CRP at 1.7-no obvious signs of infection.  Question related to obesity.  Patient recommend continued weight loss/control of diabetes  as per PCP.  # #Since patient is clinically stable I think is reasonable for the patient to follow-up with PCP/can follow-up with us  as needed.  Patient comfortable with the plan; to call us  if any questions or concerns in the interim.   # DISPOSITION: # Follow-up as needed-Dr.B   No orders of the defined types were placed in this encounter.  All questions were answered. The patient knows to call the clinic with any problems, questions or concerns.      Cindy JONELLE Joe, MD 06/07/2024 2:15 PM

## 2024-10-12 ENCOUNTER — Inpatient Hospital Stay: Admission: EM | Admit: 2024-10-12 | Discharge: 2024-10-14 | DRG: 639

## 2024-10-12 ENCOUNTER — Other Ambulatory Visit: Payer: Self-pay

## 2024-10-12 DIAGNOSIS — Z794 Long term (current) use of insulin: Secondary | ICD-10-CM | POA: Diagnosis not present

## 2024-10-12 DIAGNOSIS — E876 Hypokalemia: Secondary | ICD-10-CM

## 2024-10-12 DIAGNOSIS — I10 Essential (primary) hypertension: Secondary | ICD-10-CM | POA: Diagnosis present

## 2024-10-12 DIAGNOSIS — E0810 Diabetes mellitus due to underlying condition with ketoacidosis without coma: Secondary | ICD-10-CM | POA: Diagnosis not present

## 2024-10-12 DIAGNOSIS — D649 Anemia, unspecified: Secondary | ICD-10-CM | POA: Diagnosis present

## 2024-10-12 DIAGNOSIS — E1110 Type 2 diabetes mellitus with ketoacidosis without coma: Secondary | ICD-10-CM | POA: Diagnosis present

## 2024-10-12 DIAGNOSIS — E66811 Obesity, class 1: Secondary | ICD-10-CM | POA: Diagnosis present

## 2024-10-12 DIAGNOSIS — Z7984 Long term (current) use of oral hypoglycemic drugs: Secondary | ICD-10-CM | POA: Diagnosis not present

## 2024-10-12 DIAGNOSIS — Z888 Allergy status to other drugs, medicaments and biological substances status: Secondary | ICD-10-CM | POA: Diagnosis not present

## 2024-10-12 DIAGNOSIS — Z7985 Long-term (current) use of injectable non-insulin antidiabetic drugs: Secondary | ICD-10-CM | POA: Diagnosis not present

## 2024-10-12 DIAGNOSIS — D72829 Elevated white blood cell count, unspecified: Secondary | ICD-10-CM | POA: Diagnosis present

## 2024-10-12 DIAGNOSIS — Z79899 Other long term (current) drug therapy: Secondary | ICD-10-CM | POA: Diagnosis not present

## 2024-10-12 DIAGNOSIS — Z882 Allergy status to sulfonamides status: Secondary | ICD-10-CM | POA: Diagnosis not present

## 2024-10-12 DIAGNOSIS — Z6833 Body mass index (BMI) 33.0-33.9, adult: Secondary | ICD-10-CM | POA: Diagnosis not present

## 2024-10-12 DIAGNOSIS — E785 Hyperlipidemia, unspecified: Secondary | ICD-10-CM | POA: Diagnosis present

## 2024-10-12 DIAGNOSIS — Z5329 Procedure and treatment not carried out because of patient's decision for other reasons: Secondary | ICD-10-CM | POA: Diagnosis present

## 2024-10-12 LAB — BLOOD GAS, VENOUS
Acid-base deficit: 24.7 mmol/L — ABNORMAL HIGH (ref 0.0–2.0)
Bicarbonate: 7.6 mmol/L — ABNORMAL LOW (ref 20.0–28.0)
O2 Saturation: 75.8 %
Patient temperature: 37
pCO2, Ven: 38 mmHg — ABNORMAL LOW (ref 44–60)
pH, Ven: <6.95 — CL (ref 7.25–7.43)
pO2, Ven: 51 mmHg — ABNORMAL HIGH (ref 32–45)

## 2024-10-12 LAB — GLUCOSE, CAPILLARY
Glucose-Capillary: 226 mg/dL — ABNORMAL HIGH (ref 70–99)
Glucose-Capillary: 189 mg/dL — ABNORMAL HIGH (ref 70–99)
Glucose-Capillary: 176 mg/dL — ABNORMAL HIGH (ref 70–99)
Glucose-Capillary: 167 mg/dL — ABNORMAL HIGH (ref 70–99)
Glucose-Capillary: 182 mg/dL — ABNORMAL HIGH (ref 70–99)
Glucose-Capillary: 171 mg/dL — ABNORMAL HIGH (ref 70–99)
Glucose-Capillary: 157 mg/dL — ABNORMAL HIGH (ref 70–99)
Glucose-Capillary: 159 mg/dL — ABNORMAL HIGH (ref 70–99)
Glucose-Capillary: 138 mg/dL — ABNORMAL HIGH (ref 70–99)

## 2024-10-12 LAB — BASIC METABOLIC PANEL WITH GFR
BUN: 19 mg/dL (ref 6–20)
BUN: 19 mg/dL (ref 6–20)
BUN: 18 mg/dL (ref 6–20)
CO2: <7 mmol/L — ABNORMAL LOW (ref 22–32)
CO2: <7 mmol/L — ABNORMAL LOW (ref 22–32)
CO2: <7 mmol/L — ABNORMAL LOW (ref 22–32)
Calcium: 8.3 mg/dL — ABNORMAL LOW (ref 8.9–10.3)
Calcium: 9 mg/dL (ref 8.9–10.3)
Calcium: 9.1 mg/dL (ref 8.9–10.3)
Chloride: 106 mmol/L (ref 98–111)
Chloride: 107 mmol/L (ref 98–111)
Chloride: 110 mmol/L (ref 98–111)
Creatinine, Ser: 0.94 mg/dL (ref 0.44–1.00)
Creatinine, Ser: 0.92 mg/dL (ref 0.44–1.00)
Creatinine, Ser: 0.84 mg/dL (ref 0.44–1.00)
GFR, Estimated: >60 mL/min (ref >60)
GFR, Estimated: >60 mL/min (ref >60)
GFR, Estimated: >60 mL/min (ref >60)
Glucose, Bld: 271 mg/dL — ABNORMAL HIGH (ref 70–99)
Glucose, Bld: 205 mg/dL — ABNORMAL HIGH (ref 70–99)
Glucose, Bld: 172 mg/dL — ABNORMAL HIGH (ref 70–99)
Potassium: 6.5 mmol/L (ref 3.5–5.1)
Potassium: 5.5 mmol/L — ABNORMAL HIGH (ref 3.5–5.1)
Potassium: 4.7 mmol/L (ref 3.5–5.1)
Sodium: 139 mmol/L (ref 135–145)
Sodium: 139 mmol/L (ref 135–145)
Sodium: 140 mmol/L (ref 135–145)

## 2024-10-12 LAB — URINALYSIS, ROUTINE W REFLEX MICROSCOPIC
Bilirubin Urine: NEGATIVE
Glucose, UA: >=500 mg/dL — AB
Ketones, ur: 80 mg/dL — AB
Leukocytes,Ua: NEGATIVE
Nitrite: NEGATIVE
Protein, ur: 30 mg/dL — AB
Specific Gravity, Urine: 1.022 (ref 1.005–1.030)
pH: 5 (ref 5.0–8.0)

## 2024-10-12 LAB — CBC WITH DIFFERENTIAL/PLATELET
Abs Immature Granulocytes: 0.33 K/uL — ABNORMAL HIGH (ref 0.00–0.07)
Basophils Absolute: 0.1 K/uL (ref 0.0–0.1)
Basophils Relative: 1 %
Eosinophils Absolute: 0 K/uL (ref 0.0–0.5)
Eosinophils Relative: 0 %
HCT: 51.7 % — ABNORMAL HIGH (ref 36.0–46.0)
Hemoglobin: 15.8 g/dL — ABNORMAL HIGH (ref 12.0–15.0)
Immature Granulocytes: 2 %
Lymphocytes Relative: 11 %
Lymphs Abs: 1.5 K/uL (ref 0.7–4.0)
MCH: 29.9 pg (ref 26.0–34.0)
MCHC: 30.6 g/dL (ref 30.0–36.0)
MCV: 97.9 fL (ref 80.0–100.0)
Monocytes Absolute: 0.5 K/uL (ref 0.1–1.0)
Monocytes Relative: 4 %
Neutro Abs: 11.3 K/uL — ABNORMAL HIGH (ref 1.7–7.7)
Neutrophils Relative %: 82 %
Platelets: 343 K/uL (ref 150–400)
RBC: 5.28 MIL/uL — ABNORMAL HIGH (ref 3.87–5.11)
RDW: 13.6 % (ref 11.5–15.5)
WBC: 13.8 K/uL — ABNORMAL HIGH (ref 4.0–10.5)
nRBC: 0 % (ref 0.0–0.2)

## 2024-10-12 LAB — LIPASE, BLOOD: Lipase: 33 U/L (ref 11–51)

## 2024-10-12 LAB — COMPREHENSIVE METABOLIC PANEL WITH GFR
ALT: 26 U/L (ref 0–44)
AST: 28 U/L (ref 15–41)
Albumin: 4 g/dL (ref 3.5–5.0)
Alkaline Phosphatase: 87 U/L (ref 38–126)
BUN: 20 mg/dL (ref 6–20)
CO2: <7 mmol/L — ABNORMAL LOW (ref 22–32)
Calcium: 7.7 mg/dL — ABNORMAL LOW (ref 8.9–10.3)
Chloride: 104 mmol/L (ref 98–111)
Creatinine, Ser: 0.96 mg/dL (ref 0.44–1.00)
GFR, Estimated: >60 mL/min (ref >60)
Glucose, Bld: 260 mg/dL — ABNORMAL HIGH (ref 70–99)
Potassium: 5.1 mmol/L (ref 3.5–5.1)
Sodium: 139 mmol/L (ref 135–145)
Total Bilirubin: 0.3 mg/dL (ref 0.0–1.2)
Total Protein: 6.9 g/dL (ref 6.5–8.1)

## 2024-10-12 LAB — POTASSIUM: Potassium: 4.6 mmol/L (ref 3.5–5.1)

## 2024-10-12 LAB — CBG MONITORING, ED
Glucose-Capillary: 236 mg/dL — ABNORMAL HIGH (ref 70–99)
Glucose-Capillary: 242 mg/dL — ABNORMAL HIGH (ref 70–99)
Glucose-Capillary: 221 mg/dL — ABNORMAL HIGH (ref 70–99)

## 2024-10-12 LAB — BETA-HYDROXYBUTYRIC ACID
Beta-Hydroxybutyric Acid: >8 mmol/L — ABNORMAL HIGH (ref 0.05–0.27)
Beta-Hydroxybutyric Acid: 3.56 mmol/L — ABNORMAL HIGH (ref 0.05–0.27)

## 2024-10-12 MED ORDER — SODIUM CHLORIDE 0.9 % IV SOLN: INTRAVENOUS | Status: DC

## 2024-10-12 MED ORDER — ONDANSETRON HCL 4 MG/2ML IJ SOLN
4.0000 mg | Freq: Four times a day (QID) | INTRAMUSCULAR | Status: DC | PRN
  Administered 2024-10-12: 4 mg via INTRAVENOUS
  Filled 2024-10-12: qty 2

## 2024-10-12 MED ORDER — ENOXAPARIN SODIUM 40 MG/0.4ML IJ SOSY
40.0000 mg | PREFILLED_SYRINGE | INTRAMUSCULAR | Status: DC
  Administered 2024-10-12 – 2024-10-13 (×2): 40 mg via SUBCUTANEOUS
  Filled 2024-10-12 (×2): qty 0.4

## 2024-10-12 MED ORDER — ACETAMINOPHEN 650 MG RE SUPP: 650.0000 mg | Freq: Four times a day (QID) | RECTAL | Status: DC | PRN

## 2024-10-12 MED ORDER — DEXTROSE IN LACTATED RINGERS 5 % IV SOLN: INTRAVENOUS | Status: AC

## 2024-10-12 MED ORDER — ONDANSETRON HCL 4 MG PO TABS: 4.0000 mg | ORAL_TABLET | Freq: Four times a day (QID) | ORAL | Status: DC | PRN

## 2024-10-12 MED ORDER — IPRATROPIUM-ALBUTEROL 0.5-2.5 (3) MG/3ML IN SOLN
3.0000 mL | Freq: Once | RESPIRATORY_TRACT | Status: AC
  Administered 2024-10-12: 3 mL via RESPIRATORY_TRACT
  Filled 2024-10-12: qty 3

## 2024-10-12 MED ORDER — ACETAMINOPHEN 325 MG PO TABS: 650.0000 mg | ORAL_TABLET | Freq: Four times a day (QID) | ORAL | Status: DC | PRN

## 2024-10-12 MED ORDER — CHLORHEXIDINE GLUCONATE CLOTH 2 % EX PADS
6.0000 | MEDICATED_PAD | Freq: Every day | CUTANEOUS | Status: DC
  Administered 2024-10-13 – 2024-10-14 (×2): 6 via TOPICAL

## 2024-10-12 MED ORDER — ORAL CARE MOUTH RINSE: 15.0000 mL | OROMUCOSAL | Status: DC | PRN

## 2024-10-12 MED ORDER — MORPHINE SULFATE (PF) 2 MG/ML IV SOLN: 2.0000 mg | INTRAVENOUS | Status: DC | PRN

## 2024-10-12 MED ORDER — SODIUM CHLORIDE 0.9 % IV BOLUS
1000.0000 mL | Freq: Once | INTRAVENOUS | Status: AC
  Administered 2024-10-12: 1000 mL via INTRAVENOUS

## 2024-10-12 MED ORDER — OXYCODONE HCL 5 MG PO TABS: 5.0000 mg | ORAL_TABLET | ORAL | Status: DC | PRN

## 2024-10-12 MED ORDER — PANTOPRAZOLE SODIUM 40 MG IV SOLR
40.0000 mg | Freq: Two times a day (BID) | INTRAVENOUS | Status: DC
  Administered 2024-10-12 – 2024-10-14 (×4): 40 mg via INTRAVENOUS
  Filled 2024-10-12 (×4): qty 10

## 2024-10-12 MED ORDER — DEXTROSE 50 % IV SOLN
0.0000 mL | INTRAVENOUS | Status: DC | PRN
  Filled 2024-10-12: qty 50

## 2024-10-12 MED ORDER — LACTATED RINGERS IV SOLN: INTRAVENOUS | Status: AC

## 2024-10-12 MED ORDER — POLYETHYLENE GLYCOL 3350 17 G PO PACK: 17.0000 g | PACK | Freq: Every day | ORAL | Status: DC | PRN

## 2024-10-12 MED ORDER — POTASSIUM CHLORIDE 10 MEQ/100ML IV SOLN
10.0000 meq | INTRAVENOUS | Status: AC
  Administered 2024-10-12 (×4): 10 meq via INTRAVENOUS
  Filled 2024-10-12 (×4): qty 100

## 2024-10-12 MED ORDER — INSULIN REGULAR(HUMAN) IN NACL 100-0.9 UT/100ML-% IV SOLN
INTRAVENOUS | Status: DC
  Administered 2024-10-12: 9 [IU]/h via INTRAVENOUS
  Administered 2024-10-13: 1.7 [IU]/h via INTRAVENOUS
  Filled 2024-10-12 (×2): qty 100

## 2024-10-12 MED ORDER — LISINOPRIL 5 MG PO TABS
2.5000 mg | ORAL_TABLET | Freq: Every day | ORAL | Status: DC
  Administered 2024-10-13 – 2024-10-14 (×2): 2.5 mg via ORAL
  Filled 2024-10-12 (×2): qty 1

## 2024-10-12 NOTE — ED Provider Notes (Signed)
 [facility] Provider Note    Event Date/Time   First MD Initiated Contact with Patient 10/12/24 [phone]     (approximate)   History   Emesis and Diarrhea   HPI  Nira Senya Hinzman is a 58 y.o. female with a past medical history of diabetes presenting to the emergency department via EMS from home for 2 days of nausea, vomiting, and diarrhea.  Patient reports that her grandson is sick with similar symptoms.  She reports that she has a history of DKA several years ago and that this feels similar.  She received 600 cc of normal saline by EMS.  She declined antiemetics by EMS stating that she was feeling improved.  She denies any abdominal pain, urinary symptoms, chest pain, shortness of breath, cough, or congestion.  She reports hip pain from lying in bed for the last 2 days as well as a subjective fever.     Physical Exam   Triage Vital Signs: ED Triage Vitals  Encounter Vitals Group     BP 10/12/24 0936 121/65     Girls Systolic BP Percentile --      Girls Diastolic BP Percentile --      Boys Systolic BP Percentile --      Boys Diastolic BP Percentile --      Pulse Rate 10/12/24 0932 (!) 120     Resp 10/12/24 0932 (!) 24     Temp 10/12/24 0932 97.6 F (36.4 C)     Temp Source 10/12/24 0932 Oral     SpO2 10/12/24 0932 100 %     Weight 10/12/24 0932 190 lb (86.2 kg)     Height 10/12/24 0932 5' 4 (1.626 m)     Head Circumference --      Peak Flow --      Pain Score 10/12/24 0932 5     Pain Loc --      Pain Education --      Exclude from Growth Chart --     Most recent vital signs: Vitals:   10/12/24 0932 10/12/24 0936  BP:  121/65  Pulse: (!) 120   Resp: (!) 24   Temp: 97.6 F (36.4 C)   SpO2: 100%      General: Awake, no distress.  CV:  Good peripheral perfusion.  Regular rate and rhythm with no murmurs rubs or gallops Resp:  Normal effort.  Lungs clear to auscultation Abd:  No distention.  Nontender to palpation, no masses, no  guarding, no rebound    ED Results / Procedures / Treatments   Labs (all labs ordered are listed, but only abnormal results are displayed) Labs Reviewed  COMPREHENSIVE METABOLIC PANEL WITH GFR  LIPASE, BLOOD  CBC WITH DIFFERENTIAL/PLATELET  URINALYSIS, ROUTINE W REFLEX MICROSCOPIC  KETONES, URINE     EKG  EKG shows a sinus tachycardia rate of 123 bpm, prolonged QT interval, normal axis, no significant ST elevations or depressions.   RADIOLOGY     PROCEDURES:  Critical Care performed: Yes, see critical care procedure note(s) CRITICAL CARE Performed by: Reche CHRISTELLA Leventhal   Total critical care time: 30 minutes  Critical care time was exclusive of separately billable procedures and treating other patients.  Critical care was necessary to treat or prevent imminent or life-threatening deterioration.  Critical care was time spent personally by me on the following activities: development of treatment plan with patient and/or surrogate as well as nursing, discussions with consultants, evaluation of patient's response to treatment,  examination of patient, obtaining history from patient or surrogate, ordering and performing treatments and interventions, ordering and review of laboratory studies, ordering and review of radiographic studies, pulse oximetry and re-evaluation of patient's condition.   Procedures   MEDICATIONS ORDERED IN ED: Medications  insulin  regular, human (MYXREDLIN ) 100 units/ 100 mL infusion (9.5 Units/hr Intravenous Rate/Dose Change 10/12/24 1354)  lactated ringers  infusion (0 mLs Intravenous Hold 10/12/24 1259)  dextrose  5 % in lactated ringers  infusion ( Intravenous New Bag/Given 10/12/24 1258)  dextrose  50 % solution 0-50 mL (has no administration in time range)  potassium chloride  10 mEq in 100 mL IVPB (10 mEq Intravenous New Bag/Given 10/12/24 1357)  enoxaparin  (LOVENOX ) injection 40 mg (has no administration in time range)  0.9 %  sodium chloride   infusion (has no administration in time range)  acetaminophen  (TYLENOL ) tablet 650 mg (has no administration in time range)    Or  acetaminophen  (TYLENOL ) suppository 650 mg (has no administration in time range)  oxyCODONE  (Oxy IR/ROXICODONE ) immediate release tablet 5 mg (has no administration in time range)  morphine  (PF) 2 MG/ML injection 2 mg (has no administration in time range)  polyethylene glycol (MIRALAX  / GLYCOLAX ) packet 17 g (has no administration in time range)  ondansetron  (ZOFRAN ) tablet 4 mg (has no administration in time range)    Or  ondansetron  (ZOFRAN ) injection 4 mg (has no administration in time range)  sodium chloride  0.9 % bolus 1,000 mL (0 mLs Intravenous Stopped 10/12/24 1049)  sodium chloride  0.9 % bolus 1,000 mL (0 mLs Intravenous Stopped 10/12/24 1302)     IMPRESSION / MDM / ASSESSMENT AND PLAN / ED COURSE  I reviewed the triage vital signs and the nursing notes.                              Differential diagnosis includes, but is not limited to, DKA, HHS, UTI, gastroenteritis, colitis.  Patient's presentation is most consistent with severe exacerbation of chronic illness.  Patient is a 58 year old female with a past medical history of diabetes presenting to the emergency department from home via EMS for nausea, vomiting, and diarrhea for the last 2 days.  Patient received 600 cc of IV fluids by EMS.  She declines antiemetics at this time.  Lab work ordered for further evaluation.  VBG has resulted and shows a pH of 6.95.  Further IV fluids ordered.  Pending CMP results.  CMP has resulted and shows a potassium of 2.5, glucose of 260, bicarb of less than 7.  Unable to calculate anion gap.  IV potassium repletion ordered as well as DKA order set.  I discussed results with the patient and family.  Discussed plan for admission for further workup and treatment and they are agreeable.  I discussed the patient's case with the hospitalist who is in agreement with  plan for admission at this time.      FINAL CLINICAL IMPRESSION(S) / ED DIAGNOSES   Final diagnoses:  Diabetic ketoacidosis without coma associated with type 2 diabetes mellitus (HCC)  Hypokalemia     Rx / DC Orders   ED Discharge Orders     None        Note:  This document was prepared using Dragon voice recognition software and may include unintentional dictation errors.   Rexford Reche HERO, MD 10/12/24 1420

## 2024-10-12 NOTE — H&P (Addendum)
 History and Physical    Allison Glover FMW:[id number] DOB: 1966/06/05 DOA: 10/12/2024  DOS: the patient was seen and examined on 10/12/2024  PCP: Rojelio Loader, MD   Patient coming from: Home  I have personally briefly reviewed patient's old medical records in [facility]  Chief Complaint: Nausea vomiting and diarrhea for 2 days  HPI: Allison Glover is a pleasant 58 y.o. female with medical history significant for diabetes, HTN, HLD and chronic anemia who came in from home for nausea vomiting and diarrhea for the last 2 days.  Patient stated that she had a grandson who was sick at home since Thursday.  She started feeling unwell since Sunday.  He has a history of DKA in the past.  She denies any fever, chills, chest pain, cough, shortness of breath, dysuria, hematuria.  ED Course: Upon arrival to the ED, patient is found to be acidotic at 6.95 pH, bicarb was 7, blood glucose was 260, unable to calculate anion gap, positive beta-hydroxybutyrate more than 8, urine glucose was more than 500.  Patient was diagnosed with DKA, started on IV fluid and insulin drip.  Hospitalist service was consulted for evaluation for admission.  Review of Systems:  ROS  All other systems negative except as noted in the HPI.  Past Medical History:  Diagnosis Date   Anemia    Diabetes mellitus without complication (HCC)     Past Surgical History:  Procedure Laterality Date   CESAREAN SECTION     19 74   CESAREAN SECTION       reports that she has never smoked. She has never used smokeless tobacco. She reports that she does not drink alcohol and does not use drugs.  Allergies  Allergen Reactions   Atorvastatin Other (See Comments)   Metformin Diarrhea    XR ONLY   Sulfa Antibiotics Rash    Family History  Problem Relation Age of Onset   Cancer Father     Prior to Admission medications   Medication Sig Start Date End Date Taking? Authorizing Provider  atorvastatin  (LIPITOR) 40 MG tablet Take 40 mg by mouth daily.    [provider]  Cholecalciferol 125 MCG (5000 UT) TABS Take 1 tablet by mouth daily.    [provider]  empagliflozin (JARDIANCE) 25 MG TABS tablet Take 25 mg by mouth daily.    [provider]  fluticasone  (FLONASE ) 50 MCG/ACT nasal spray Place 2 sprays into both nostrils daily. 06/06/15   Jama Mitzie ORN, PA-C  glimepiride (AMARYL) 4 MG tablet Take 8 mg by mouth daily with breakfast.    [provider]  glipiZIDE (GLUCOTROL XL) 5 MG 24 hr tablet Take 5 mg by mouth 2 (two) times daily.    [provider]  lisinopril  (PRINIVIL ,ZESTRIL ) 2.5 MG tablet Take 2.5 mg by mouth daily.    [provider]  nitrofurantoin , macrocrystal-monohydrate, (MACROBID ) 100 MG capsule Take 1 capsule (100 mg total) by mouth 2 (two) times daily. 11/30/16   Servando Hire, MD  pravastatin (PRAVACHOL) 10 MG tablet Take 10 mg by mouth daily.    [provider]  Semaglutide, 2 MG/DOSE, (OZEMPIC, 2 MG/DOSE,) 8 MG/3ML SOPN Inject 2 mg into the skin once a week.    [provider]    Physical Exam: Vitals:   10/12/24 1345 10/12/24 1402 10/12/24 1529 10/12/24 1530  BP:   (!) 93/52 121/61  Pulse: (!) 133  (!) 132 (!) 136  Resp: (!) 26  (!) 25 (!)  27  Temp:  98.1 F (36.7 C) 97.8 F (36.6 C)   TempSrc:  Axillary Oral   SpO2: 98%  97% 97%  Weight:   87.9 kg   Height:   5' 4 (1.626 m)     Physical Exam   Constitutional: Alert, awake, calm, comfortable HEENT: Neck supple Respiratory: Clear to auscultation B/L, no wheezing, no rales.  Cardiovascular: Regular rate and rhythm, no murmurs / rubs / gallops. No extremity edema. 2+ pedal pulses. No carotid bruits.  Abdomen: Soft, no tenderness, Bowel sounds positive.  Musculoskeletal: no clubbing / cyanosis. Good ROM, no contractures. Normal muscle tone.  Skin: no rashes, lesions, ulcers. Neurologic: CN 2-12 grossly intact. Sensation intact, No focal  deficit identified Psychiatric: Alert and oriented x 3. Normal mood.    Labs on Admission: I have personally reviewed following labs and imaging studies  CBC: Recent Labs  Lab 10/12/24 0941  WBC 13.8*  NEUTROABS 11.3*  HGB 15.8*  HCT 51.7*  MCV 97.9  PLT 343   Basic Metabolic Panel: Recent Labs  Lab 10/12/24 0941  NA 139  K 5.1  CL 104  CO2 <7*  GLUCOSE 260*  BUN 20  CREATININE 0.96  CALCIUM 7.7*   GFR: Estimated Creatinine Clearance: 71.1 mL/min (by C-G formula based on SCr of 0.96 mg/dL). Liver Function Tests: Recent Labs  Lab 10/12/24 0941  AST 28  ALT 26  ALKPHOS 87  BILITOT 0.3  PROT 6.9  ALBUMIN 4.0   Recent Labs  Lab 10/12/24 0941  LIPASE 33   No results for input(s): AMMONIA in the last 168 hours. Coagulation Profile: No results for input(s): INR, PROTIME in the last 168 hours. Cardiac Enzymes: No results for input(s): CKTOTAL, CKMB, CKMBINDEX, TROPONINI, TROPONINIHS in the last 168 hours. BNP (last 3 results) No results for input(s): BNP in the last 8760 hours. HbA1C: No results for input(s): HGBA1C in the last 72 hours. CBG: Recent Labs  Lab 10/12/24 1239 10/12/24 1352 10/12/24 1459 10/12/24 1527  GLUCAP 236* 242* 221* 226*   Lipid Profile: No results for input(s): CHOL, HDL, LDLCALC, TRIG, CHOLHDL, LDLDIRECT in the last 72 hours. Thyroid Function Tests: No results for input(s): TSH, T4TOTAL, FREET4, T3FREE, THYROIDAB in the last 72 hours. Anemia Panel: No results for input(s): VITAMINB12, FOLATE, FERRITIN, TIBC, IRON, RETICCTPCT in the last 72 hours. Urine analysis:    Component Value Date/Time   COLORURINE YELLOW (A) 10/12/2024 0941   APPEARANCEUR CLEAR (A) 10/12/2024 0941   LABSPEC 1.022 10/12/2024 0941   PHURINE 5.0 10/12/2024 0941   GLUCOSEU >=500 (A) 10/12/2024 0941   HGBUR SMALL (A) 10/12/2024 0941   BILIRUBINUR NEGATIVE 10/12/2024 0941   KETONESUR 80 (A) 10/12/2024  0941   PROTEINUR 30 (A) 10/12/2024 0941   NITRITE NEGATIVE 10/12/2024 0941   LEUKOCYTESUR NEGATIVE 10/12/2024 0941    Radiological Exams on Admission: I have personally reviewed images No results found.  EKG: My personal interpretation of EKG shows: Sinus tachycardia at 120 bpm with motion artifact, no ST elevation    Assessment/Plan Principal Problem:   DKA (diabetic ketoacidosis) (HCC)    Assessment and Plan: 58 year old female who has a history of chronic type 2 diabetes, HTN, HLD who came into ED complaining of nausea vomiting and diarrhea for the last 2 days.  Patient was found to have high anion gap metabolic acidosis with DKA.  1.  Diabetic ketoacidosis - She will be admitted to hospital as inpatient in the stepdown unit. - She normally takes Ozempic, Jardiance  and glimepiride at home. - Those medications will be resumed once patient gets better and gets discharged home. - She has been started on IV fluid and insulin  drip in the emergency room which will be continued. - Insulin  drip will be continued per protocol. - Patient will be n.p.o. - Consider transitioning to subcutaneous insulin  once gap closes and acidosis improves - Check BMP per protocol  2.  Nausea vomiting and diarrhea with sick contact - May be related to norovirus - I have ordered stool pathogen - Will follow that report.  3.  History of anemia - Hemoglobin hematocrit appears to be stable at this point  4.  HTN/HLD - Patient says she does not have those conditions but she has a medication to protect kidney - Continue to monitor and resume those medication at discharge  5.  Leukocytosis - May be related to DKA versus nausea vomiting diarrhea - She does not have fever, cough, dysuria - Will hold off using antibiotics - Will continue to monitor leukocytosis  GI prophylaxis: She will be given Protonix  while she is in the hospital   Addendum: Repeat potassium came back 6.5.  EKG was done which did  not show any tall T waves.  Patient will given albuterol  nebulization.  Repeat BMP.  Continue to monitor BMP.  With IV insulin  going along with IV fluids she should be okay.  Will continue to monitor BMP.   DVT prophylaxis: Lovenox  Code Status: Full Code Family Communication: Husband and daughter at bedside Disposition Plan: Home Consults called: None Admission status: Inpatient, Step Down Unit   Nena Rebel, MD [facility] 10/12/2024, 4:02 PM

## 2024-10-12 NOTE — ED Notes (Signed)
 CCMD called at this time to place pt on monitoring.

## 2024-10-12 NOTE — ED Notes (Signed)
 Critical result Ph 6.91 relayed to MD [facility]

## 2024-10-12 NOTE — Plan of Care (Signed)

## 2024-10-12 NOTE — ED Triage Notes (Signed)
 Pt to ED via [facility] from home for N/V/D. Pt reports feeling unwell x2 days with N/V/D starting last night. EMS reports family member has same symptoms. Pt has hx of DKA. Pt states she feels similar to how she felt when she was in DKA. Pt denies urinary symptoms or abdominal pain. EMS vitals  99% RA  BP 152/110 Cbg 202

## 2024-10-13 ENCOUNTER — Inpatient Hospital Stay

## 2024-10-13 DIAGNOSIS — E1110 Type 2 diabetes mellitus with ketoacidosis without coma: Principal | ICD-10-CM

## 2024-10-13 LAB — CBC
HCT: 40.1 % (ref 36.0–46.0)
Hemoglobin: 13.8 g/dL (ref 12.0–15.0)
MCH: 30.6 pg (ref 26.0–34.0)
MCHC: 34.4 g/dL (ref 30.0–36.0)
MCV: 88.9 fL (ref 80.0–100.0)
Platelets: 277 K/uL (ref 150–400)
RBC: 4.51 MIL/uL (ref 3.87–5.11)
RDW: 14.3 % (ref 11.5–15.5)
WBC: 11.6 K/uL — ABNORMAL HIGH (ref 4.0–10.5)
nRBC: 0 % (ref 0.0–0.2)

## 2024-10-13 LAB — MAGNESIUM: Magnesium: 2 mg/dL (ref 1.7–2.4)

## 2024-10-13 LAB — BASIC METABOLIC PANEL WITH GFR
Anion gap: 17 — ABNORMAL HIGH (ref 5–15)
Anion gap: 17 — ABNORMAL HIGH (ref 5–15)
Anion gap: 14 (ref 5–15)
Anion gap: 15 (ref 5–15)
Anion gap: 15 (ref 5–15)
BUN: 17 mg/dL (ref 6–20)
BUN: 16 mg/dL (ref 6–20)
BUN: 15 mg/dL (ref 6–20)
BUN: 13 mg/dL (ref 6–20)
BUN: 11 mg/dL (ref 6–20)
CO2: 10 mmol/L — ABNORMAL LOW (ref 22–32)
CO2: 13 mmol/L — ABNORMAL LOW (ref 22–32)
CO2: 16 mmol/L — ABNORMAL LOW (ref 22–32)
CO2: 16 mmol/L — ABNORMAL LOW (ref 22–32)
CO2: 18 mmol/L — ABNORMAL LOW (ref 22–32)
Calcium: 9.1 mg/dL (ref 8.9–10.3)
Calcium: 9.2 mg/dL (ref 8.9–10.3)
Calcium: 9.3 mg/dL (ref 8.9–10.3)
Calcium: 9.3 mg/dL (ref 8.9–10.3)
Calcium: 8.7 mg/dL — ABNORMAL LOW (ref 8.9–10.3)
Chloride: 110 mmol/L (ref 98–111)
Chloride: 111 mmol/L (ref 98–111)
Chloride: 111 mmol/L (ref 98–111)
Chloride: 111 mmol/L (ref 98–111)
Chloride: 109 mmol/L (ref 98–111)
Creatinine, Ser: 0.82 mg/dL (ref 0.44–1.00)
Creatinine, Ser: 0.73 mg/dL (ref 0.44–1.00)
Creatinine, Ser: 0.66 mg/dL (ref 0.44–1.00)
Creatinine, Ser: 0.63 mg/dL (ref 0.44–1.00)
Creatinine, Ser: 0.64 mg/dL (ref 0.44–1.00)
GFR, Estimated: >60 mL/min (ref >60)
GFR, Estimated: >60 mL/min (ref >60)
GFR, Estimated: >60 mL/min (ref >60)
GFR, Estimated: >60 mL/min (ref >60)
GFR, Estimated: >60 mL/min (ref >60)
Glucose, Bld: 163 mg/dL — ABNORMAL HIGH (ref 70–99)
Glucose, Bld: 148 mg/dL — ABNORMAL HIGH (ref 70–99)
Glucose, Bld: 147 mg/dL — ABNORMAL HIGH (ref 70–99)
Glucose, Bld: 164 mg/dL — ABNORMAL HIGH (ref 70–99)
Glucose, Bld: 138 mg/dL — ABNORMAL HIGH (ref 70–99)
Potassium: 4.9 mmol/L (ref 3.5–5.1)
Potassium: 4.1 mmol/L (ref 3.5–5.1)
Potassium: 3.9 mmol/L (ref 3.5–5.1)
Potassium: 3.8 mmol/L (ref 3.5–5.1)
Potassium: 4 mmol/L (ref 3.5–5.1)
Sodium: 137 mmol/L (ref 135–145)
Sodium: 140 mmol/L (ref 135–145)
Sodium: 140 mmol/L (ref 135–145)
Sodium: 142 mmol/L (ref 135–145)
Sodium: 143 mmol/L (ref 135–145)

## 2024-10-13 LAB — HIV ANTIBODY (ROUTINE TESTING W REFLEX): HIV Screen 4th Generation wRfx: NONREACTIVE

## 2024-10-13 LAB — GLUCOSE, CAPILLARY
Glucose-Capillary: 119 mg/dL — ABNORMAL HIGH (ref 70–99)
Glucose-Capillary: 127 mg/dL — ABNORMAL HIGH (ref 70–99)
Glucose-Capillary: 129 mg/dL — ABNORMAL HIGH (ref 70–99)
Glucose-Capillary: 132 mg/dL — ABNORMAL HIGH (ref 70–99)
Glucose-Capillary: 135 mg/dL — ABNORMAL HIGH (ref 70–99)
Glucose-Capillary: 135 mg/dL — ABNORMAL HIGH (ref 70–99)
Glucose-Capillary: 136 mg/dL — ABNORMAL HIGH (ref 70–99)
Glucose-Capillary: 139 mg/dL — ABNORMAL HIGH (ref 70–99)
Glucose-Capillary: 139 mg/dL — ABNORMAL HIGH (ref 70–99)
Glucose-Capillary: 142 mg/dL — ABNORMAL HIGH (ref 70–99)
Glucose-Capillary: 143 mg/dL — ABNORMAL HIGH (ref 70–99)
Glucose-Capillary: 143 mg/dL — ABNORMAL HIGH (ref 70–99)
Glucose-Capillary: 144 mg/dL — ABNORMAL HIGH (ref 70–99)
Glucose-Capillary: 146 mg/dL — ABNORMAL HIGH (ref 70–99)
Glucose-Capillary: 150 mg/dL — ABNORMAL HIGH (ref 70–99)
Glucose-Capillary: 151 mg/dL — ABNORMAL HIGH (ref 70–99)
Glucose-Capillary: 154 mg/dL — ABNORMAL HIGH (ref 70–99)
Glucose-Capillary: 154 mg/dL — ABNORMAL HIGH (ref 70–99)
Glucose-Capillary: 159 mg/dL — ABNORMAL HIGH (ref 70–99)

## 2024-10-13 LAB — HEMOGLOBIN A1C
Hgb A1c MFr Bld: 7.3 % — ABNORMAL HIGH (ref 4.8–5.6)
Mean Plasma Glucose: 162.81 mg/dL

## 2024-10-13 LAB — BETA-HYDROXYBUTYRIC ACID
Beta-Hydroxybutyric Acid: 2.7 mmol/L — ABNORMAL HIGH (ref 0.05–0.27)
Beta-Hydroxybutyric Acid: 2.13 mmol/L — ABNORMAL HIGH (ref 0.05–0.27)
Beta-Hydroxybutyric Acid: 2.33 mmol/L — ABNORMAL HIGH (ref 0.05–0.27)
Beta-Hydroxybutyric Acid: 3.09 mmol/L — ABNORMAL HIGH (ref 0.05–0.27)

## 2024-10-13 LAB — TSH: TSH: 1.2 u[IU]/mL (ref 0.350–4.500)

## 2024-10-13 LAB — PHOSPHORUS: Phosphorus: 1.4 mg/dL — ABNORMAL LOW (ref 2.5–4.6)

## 2024-10-13 MED ORDER — LACTATED RINGERS IV SOLN: INTRAVENOUS | Status: DC

## 2024-10-13 MED ORDER — DEXTROSE IN LACTATED RINGERS 5 % IV SOLN: INTRAVENOUS | Status: DC

## 2024-10-13 MED ORDER — SODIUM PHOSPHATES 45 MMOLE/15ML IV SOLN
30.0000 mmol | Freq: Once | INTRAVENOUS | Status: AC
  Administered 2024-10-13: 30 mmol via INTRAVENOUS
  Filled 2024-10-13: qty 10

## 2024-10-13 NOTE — TOC Initial Note (Signed)
 Transition of Care [facility]) - Initial/Assessment Note    Patient Details  Name: Allison Glover MRN: [id number] Date of Birth: 1966-04-22  [facility] of [facility]) CM/SW Contact:    Corrie JINNY Ruts, LCSW Phone Number: 10/13/2024, 11:11 AM  Clinical Narrative:                 Chart reviewed. I was able to speak with the patient husband at bedside. The patient was sleeping. I introduced myself, my role, and reason for visit. The patient husband reports that the patient has a PCP. The patient husband reports that he lives in the home with the patient.   The patient husband reports that the patient could complete task independently before admission. The patient husband reports that the patient drives herself to medical appointments. The patient husband reports that [facility]   The patient husband reports that he will assist the patient at D/C. The patient husband reports that the patient has never had HH or been admitted into a SNF in the past.   The patient husband reports that the patient has not equipment in the home. The patient husband had no questions or concerns during the time of the vitsit. There are no current ICM needs.         Patient Goals and CMS Choice            Expected Discharge Plan and Services                                              Prior Living Arrangements/Services                       Activities of Daily Living   ADL Screening (condition at time of admission) Independently performs ADLs?: Yes (appropriate for developmental age) Is the patient deaf or have difficulty hearing?: No Does the patient have difficulty seeing, even when wearing glasses/contacts?: No Does the patient have difficulty concentrating, remembering, or making decisions?: No  Permission Sought/Granted                  Emotional Assessment              Admission diagnosis:  Hypokalemia [E87.6] DKA (diabetic ketoacidosis)  (HCC) [E11.10] Diabetic ketoacidosis without coma associated with type 2 diabetes mellitus (HCC) [E11.10] Patient Active Problem List   Diagnosis Date Noted   DKA (diabetic ketoacidosis) (HCC) 10/12/2024   Acquired eosinophilia 05/12/2024   PCP:  Rojelio Loader, MD Pharmacy:   [phone] [place],  - [address] [place] [address] Phone: [phone] Fax: [phone]     Social Drivers of Health (SDOH) Social History: SDOH Screenings   Food Insecurity: No Food Insecurity (10/12/2024)  Housing: Low Risk  (10/12/2024)  Transportation Needs: No Transportation Needs (10/12/2024)  Utilities: Not At Risk (10/12/2024)  Depression (PHQ2-9): Low Risk  (05/12/2024)  Financial Resource Strain: Low Risk  (10/08/2023)   Received from [facility]  Physical Activity: Insufficiently Active (04/17/2020)   Received from [facility]  Social Connections: Moderately Isolated (05/12/2024)  Stress: No Stress Concern Present (04/17/2020)   Received from [facility]  Tobacco Use: Low Risk  (10/12/2024)  Health Literacy: Adequate Health Literacy (05/12/2024)   SDOH Interventions:     Readmission Risk Interventions  No data to display

## 2024-10-13 NOTE — Progress Notes (Addendum)
 PROGRESS NOTE    Allison Glover  FMW:[id number] DOB: [birth year] DOA: 10/12/2024 PCP: Rojelio Loader, MD  Chief Complaint  Patient presents with   Emesis   Diarrhea    Hospital Course:  Allison Glover is a pleasant 58 y.o. female with medical history significant for diabetes, HTN, HLD and chronic anemia who came in from home for nausea vomiting and diarrhea for the last 2 days.  Patient stated that she had a grandson who was sick at home since Thursday with some kind of virus and diarrhea.  Patient reports diarrhea now resolved.  She did have history of DKA in the past 5 years ago  Subjective: Patient was examined at the bedside, new to me today Husband also present at the bedside Reports feeling slightly better but continues to feel tired and weak, hungry BMP shows, bicarb improved up to 16, AG 15 -Will continue insulin  drip as labs borderline.  Will bridge once bicarb improves further   Objective: Vitals:   10/13/24 0600 10/13/24 0700 10/13/24 0800 10/13/24 0900  BP:  136/88 135/71 130/75  Pulse: (!) 107 (!) 104 (!) 102 (!) 111  Resp: (!) 23 19 20 18   Temp:   98.6 F (37 C)   TempSrc:   Oral   SpO2: 93% 96% 96% 100%  Weight:      Height:        Intake/Output Summary (Last 24 hours) at 10/13/2024 1012 Last data filed at 10/13/2024 0900 Gross per 24 hour  Intake 3861.77 ml  Output 1100 ml  Net 2761.77 ml   Filed Weights   10/12/24 0932 10/12/24 1529  Weight: 86.2 kg 87.9 kg    Examination: Constitutional: Alert, awake, calm, comfortable HEENT: Neck supple Respiratory: Clear to auscultation B/L, no wheezing, no rales.  Cardiovascular: Regular rate and rhythm, no murmurs / rubs / gallops. No extremity edema. 2+ pedal pulses. No carotid bruits.  Abdomen: Soft, no tenderness, Bowel sounds positive.  Musculoskeletal: no clubbing / cyanosis. Good ROM, no contractures. Normal muscle tone.  Skin: no rashes, lesions, ulcers. Neurologic: CN 2-12 grossly  intact. Sensation intact, No focal deficit identified Psychiatric: Alert and oriented x 3. Normal mood  Assessment & Plan:  Diabetic ketoacidosis DM type 2 - HbA1c: 7.3 - Home regimen: Ozempic, Jardiance, glipizide - Suspect DKA secondary to infectious etiology vs Jardiance use - IV insulin  drip, IV fluids - Monitor BMP every 4 hours - NPO, will resume consistent carb diet once bicarb improves  Nausea, vomiting and diarrhea with sick contact - May be related to norovirus, leukocytosis improving, is tachycardic - UA neg for UTI - Pending stool pathogen, check RVP - CXR - Monitor off abx  Hypophosphatemia - Monitor and replete lytes as needed  ?  Chronic tachycardia - Patient and husband reports having chronic tachycardia - Check TSH - May need beta-blockers to prevent tachycardia induced cardiomyopathy if chronic  History of anemia - Hb stable   Obesity class I Body mass index is 33.26 kg/m. - Outpatient follow up for lifestyle modification and risk factor management   DVT prophylaxis: Lovenox  SQ   Code Status: Full Code Disposition:  Home  Consultants:  None  Procedures:  None  Antimicrobials:  Anti-infectives (From admission, onward)    None       Data Reviewed: I have personally reviewed following labs and imaging studies CBC: Recent Labs  Lab 10/12/24 0941  WBC 13.8*  NEUTROABS 11.3*  HGB 15.8*  HCT 51.7*  MCV 97.9  PLT  343   Basic Metabolic Panel: Recent Labs  Lab 10/12/24 1332 10/12/24 1627 10/12/24 2028 10/13/24 0137 10/13/24 0607  NA 139 139 140 137 140  K 6.5* 5.5* 4.7  4.6 4.9 4.1  CL 106 107 110 110 111  CO2 <7* <7* <7* 10* 13*  GLUCOSE 271* 205* 172* 163* 148*  BUN 19 19 18 17 16   CREATININE 0.94 0.92 0.84 0.82 0.73  CALCIUM 8.3* 9.0 9.1 9.1 9.2   GFR: Estimated Creatinine Clearance: 85.3 mL/min (by C-G formula based on SCr of 0.73 mg/dL). Liver Function Tests: Recent Labs  Lab 10/12/24 0941  AST 28  ALT 26   ALKPHOS 87  BILITOT 0.3  PROT 6.9  ALBUMIN 4.0   CBG: Recent Labs  Lab 10/13/24 0458 10/13/24 0605 10/13/24 0642 10/13/24 0758 10/13/24 0952  GLUCAP 159* 135* 144* 143* 139*    No results found for this or any previous visit (from the past 240 hours).   Radiology Studies: No results found.  Scheduled Meds:  Chlorhexidine  Gluconate Cloth  6 each Topical Daily   enoxaparin  (LOVENOX ) injection  40 mg Subcutaneous Q24H   lisinopril   2.5 mg Oral Daily   pantoprazole  (PROTONIX ) IV  40 mg Intravenous Q12H   Continuous Infusions:  dextrose  5% lactated ringers  125 mL/hr at 10/13/24 0900   insulin  1.7 Units/hr (10/13/24 0900)   lactated ringers  Stopped (10/12/24 1259)     LOS: 1 day  MDM: Patient is high risk for one or more organ failure.  They necessitate ongoing hospitalization for continued IV therapies and subsequent lab monitoring. Total time spent interpreting labs and vitals, reviewing the medical record, coordinating care amongst consultants and care team members, directly assessing and discussing care with the patient and/or family: 55 min Laree Lock, MD [facility]  To contact the attending physician between 7A-7P please use Epic Chat. To contact the covering physician during after hours 7P-7A, please review Amion.  10/13/2024, 10:12 AM   *This document has been created with the assistance of dictation software. Please excuse typographical errors. *

## 2024-10-13 NOTE — Plan of Care (Signed)
°  Problem: Education: Goal: Ability to describe self-care measures that may prevent or decrease complications (Diabetes Survival Skills Education) will improve Outcome: Progressing   Problem: Coping: Goal: Ability to adjust to condition or change in health will improve Outcome: Progressing   Problem: Metabolic: Goal: Ability to maintain appropriate glucose levels will improve Outcome: Progressing   Problem: Clinical Measurements: Goal: Will remain free from infection Outcome: Progressing Goal: Diagnostic test results will improve Outcome: Progressing

## 2024-10-14 ENCOUNTER — Other Ambulatory Visit (HOSPITAL_COMMUNITY): Payer: Self-pay

## 2024-10-14 LAB — BASIC METABOLIC PANEL WITH GFR
Anion gap: 14 (ref 5–15)
Anion gap: 15 (ref 5–15)
BUN: 10 mg/dL (ref 6–20)
BUN: 11 mg/dL (ref 6–20)
CO2: 18 mmol/L — ABNORMAL LOW (ref 22–32)
CO2: 19 mmol/L — ABNORMAL LOW (ref 22–32)
Calcium: 8.4 mg/dL — ABNORMAL LOW (ref 8.9–10.3)
Calcium: 8.8 mg/dL — ABNORMAL LOW (ref 8.9–10.3)
Chloride: 108 mmol/L (ref 98–111)
Chloride: 111 mmol/L (ref 98–111)
Creatinine, Ser: 0.51 mg/dL (ref 0.44–1.00)
Creatinine, Ser: 0.56 mg/dL (ref 0.44–1.00)
GFR, Estimated: >60 mL/min (ref >60)
GFR, Estimated: >60 mL/min (ref >60)
Glucose, Bld: 126 mg/dL — ABNORMAL HIGH (ref 70–99)
Glucose, Bld: 146 mg/dL — ABNORMAL HIGH (ref 70–99)
Potassium: 3.1 mmol/L — ABNORMAL LOW (ref 3.5–5.1)
Potassium: 3.6 mmol/L (ref 3.5–5.1)
Sodium: 141 mmol/L (ref 135–145)
Sodium: 143 mmol/L (ref 135–145)

## 2024-10-14 LAB — PROCALCITONIN: Procalcitonin: 0.14 ng/mL

## 2024-10-14 LAB — GLUCOSE, CAPILLARY
Glucose-Capillary: 147 mg/dL — ABNORMAL HIGH (ref 70–99)
Glucose-Capillary: 118 mg/dL — ABNORMAL HIGH (ref 70–99)
Glucose-Capillary: 145 mg/dL — ABNORMAL HIGH (ref 70–99)
Glucose-Capillary: 130 mg/dL — ABNORMAL HIGH (ref 70–99)
Glucose-Capillary: 142 mg/dL — ABNORMAL HIGH (ref 70–99)
Glucose-Capillary: 137 mg/dL — ABNORMAL HIGH (ref 70–99)
Glucose-Capillary: 136 mg/dL — ABNORMAL HIGH (ref 70–99)
Glucose-Capillary: 128 mg/dL — ABNORMAL HIGH (ref 70–99)

## 2024-10-14 LAB — RESPIRATORY PANEL BY PCR

## 2024-10-14 LAB — CBC
HCT: 34.3 % — ABNORMAL LOW (ref 36.0–46.0)
Hemoglobin: 11.7 g/dL — ABNORMAL LOW (ref 12.0–15.0)
MCH: 30 pg (ref 26.0–34.0)
MCHC: 34.1 g/dL (ref 30.0–36.0)
MCV: 87.9 fL (ref 80.0–100.0)
Platelets: 272 K/uL (ref 150–400)
RBC: 3.9 MIL/uL (ref 3.87–5.11)
RDW: 14.4 % (ref 11.5–15.5)
WBC: 9.1 K/uL (ref 4.0–10.5)
nRBC: 0 % (ref 0.0–0.2)

## 2024-10-14 LAB — PHOSPHORUS: Phosphorus: 1.9 mg/dL — ABNORMAL LOW (ref 2.5–4.6)

## 2024-10-14 LAB — MAGNESIUM: Magnesium: 1.9 mg/dL (ref 1.7–2.4)

## 2024-10-14 MED ORDER — INSULIN GLARGINE 100 UNIT/ML ~~LOC~~ SOLN
12.0000 [IU] | Freq: Every day | SUBCUTANEOUS | Status: DC
  Administered 2024-10-14: 12 [IU] via SUBCUTANEOUS
  Filled 2024-10-14: qty 0.12

## 2024-10-14 MED ORDER — LINAGLIPTIN 5 MG PO TABS: 5.0000 mg | ORAL_TABLET | Freq: Every day | ORAL | 1 refills | Status: AC

## 2024-10-14 MED ORDER — POTASSIUM CHLORIDE 20 MEQ PO PACK
40.0000 meq | PACK | Freq: Once | ORAL | Status: AC
  Administered 2024-10-14: 40 meq via ORAL
  Filled 2024-10-14: qty 2

## 2024-10-14 MED ORDER — POTASSIUM PHOSPHATES 15 MMOLE/5ML IV SOLN
15.0000 mmol | Freq: Once | INTRAVENOUS | Status: AC
  Administered 2024-10-14: 15 mmol via INTRAVENOUS
  Filled 2024-10-14: qty 5

## 2024-10-14 MED ADMIN — Insulin Aspart Inj Soln 100 Unit/ML: 1 [IU] | SUBCUTANEOUS | NDC 73070010011

## 2024-10-14 MED FILL — Insulin Aspart Inj Soln 100 Unit/ML: 0.0000 [IU] | INTRAMUSCULAR | Qty: 1 | Status: AC

## 2024-10-14 NOTE — Inpatient Diabetes Management (Addendum)
 Inpatient Diabetes Program Recommendations  AACE/ADA: New Consensus Statement on Inpatient Glycemic Control   Target Ranges:  Prepandial:   less than 140 mg/dL      Peak postprandial:   less than 180 mg/dL (1-2 hours)      Critically ill patients:  140 - 180 mg/dL    Latest Reference Range & Units 10/14/24 01:49 10/14/24 03:49 10/14/24 04:50 10/14/24 05:50 10/14/24 06:55 10/14/24 07:47 10/14/24 09:44 10/14/24 11:50  Glucose-Capillary 70 - 99 mg/dL 852 (H) 881 (H) 854 (H) 130 (H) 142 (H) 137 (H) 136 (H) 128 (H)    Latest Reference Range & Units 10/12/24 09:41 10/12/24 20:28 10/13/24 01:37 10/13/24 06:07 10/13/24 09:03 10/13/24 19:15  Beta-Hydroxybutyric Acid 0.05 - 0.27 mmol/L >8.00 (H) 3.56 (H) 2.70 (H) 2.13 (H) 2.33 (H) 3.09 (H)    Latest Reference Range & Units 10/12/24 09:41  CO2 22 - 32 mmol/L <7 (L)  Glucose 70 - 99 mg/dL 739 (H)  Anion gap 5 - 15  NOT CALCULATED    Latest Reference Range & Units 10/12/24 09:41  Hemoglobin A1C 4.8 - 5.6 % 7.3 (H)   Review of Glycemic Control  Diabetes history: DM2 Outpatient Diabetes medications: Ozempic 2 mg Qweek, Glipizide XL 5 mg BID, Jardiance 25 mg daily Current orders for Inpatient glycemic control: Lantus 12 units daily, Novolog 0-9 units TID with meals, Novolog 0-5 units QHS  Inpatient Diabetes Program Recommendations:    Insulin : Patient received Lantus 12 units at 8:59 am today and IV insulin  has been stopped.    Outpatient DM: May need to consider discontinuing Jardiance as outpatient due to risk of SLGT-2 medications causing DKA. May want to consider adding Tradjenta 5 mg daily and continuing Glipizide XL 5 mg BID and Ozempic 2 mg Qweek.  NOTE: Patient admitted with DKA, nausea, vomiting, and diarrhea. Initial glucose 260 mg/dl and beta-hydroxybutyric acid >8.00. DKA treated with IV insulin  and transitioned to SQ insulin  today. Per chart, patient last seen PCP on 07/16/24 and A1C was 7.5% on 07/16/24.   Addendum  10/14/24@1 :50-Patient has discharge papers and getting ready to leave the hospital. Talked with patient at bedside about DM control and discussed risk of DKA with Jardiance. Patient states she usually checks glucose one time a day but she plans to check it more often. Discussed that since Jardiance is being stopped, she may need to talk with PCP about increasing Glipizide dose or adding additional DM medication (such as Tradjenta) if glucose is consistently elevated at home. Patient states that she takes Ozempic on Thursdays. Encouraged patient to make follow up appointment with PCP within the next few days to follow up regarding DM. Patient appreciative of information discussed and has no questions at this time.  Thanks, Earnie Gainer, RN, MSN, CDCES Diabetes Coordinator Inpatient Diabetes Program [phone] (Team Pager from 8am to 5pm)

## 2024-10-14 NOTE — Discharge Summary (Signed)
 Physician Discharge Summary   Patient: Allison Glover MRN: [id number] DOB: 1966-09-16  Admit date:     10/12/2024  Discharge date: 10/14/2024  Discharge Physician: Laree Lock   PCP: Rojelio Loader, MD   Recommendations at discharge:   Follow up with PCP within 3 days - Repeat BMP, CBC, Mag, phos Monitor Blood glucose and DM regimen, HR   Discharge Diagnoses: Principal Problem:   DKA (diabetic ketoacidosis) [facility])  Hospital Course: Allison Glover is a pleasant 58 y.o. female with medical history significant for diabetes, HTN, HLD and chronic anemia who came in from home for nausea vomiting and diarrhea for the last 2 days.  Patient stated that she had a grandson who was sick at home since Thursday with some kind of virus and diarrhea.  Patient reports diarrhea now resolved.  She did have history of DKA in the past 5 years ago  DKA resolved, bridged to Lantus 12 units this morning, suggested patient to stay overnight to see requirements and repeat BMP, but insisted to go home, left AMA  Diabetic ketoacidosis - resolved DM type 2 - HbA1c: 7.3 - Home regimen: Ozempic, Jardiance, glipizide - Suspect DKA secondary to infectious etiology vs Jardiance use (blood glucose in 200's) - s/p IV insulin  drip, IV fluids, bridged to Lantus 12u this morning, SSI - Patient left AMA, does not take insulin  at home.  Recommend frequent blood glucose checks at home - Discharged on Tradjenta 5 mg, Ozempic, glipizide.  Discontinue Jardiance due to high risk of causing DKA - Follow-up with PCP within 3 days for repeat labs  Nausea, vomiting and diarrhea with sick contact - Leukocytosis resolved, diarrhea resolved -stool sample not sent - UA neg for UTI, CXR with possible opacity -but no clinical evidence of pneumonia, procalcitonin not elevated - RVP negative, Bcx NGTD.  Monitored off antibiotics   Hypophosphatemia - Monitor and replete lytes as needed   ?  Chronic tachycardia -  Patient and husband reports having chronic tachycardia - TSH normal - May need beta-blockers to prevent tachycardia induced cardiomyopathy if chronic, reports will follow-up with PCP   Obesity class I Body mass index is 33.26 kg/m. - Outpatient follow up for lifestyle modification and risk factor management   Consultants: None Procedures performed: None  Disposition: Home Diet recommendation:  Discharge Diet Orders (From admission, onward)     Start     Ordered   10/14/24 0000  Diet Carb Modified        10/14/24 1243           DISCHARGE MEDICATION: Allergies as of 10/14/2024       Reactions   Atorvastatin Other (See Comments)   Metformin Diarrhea   XR ONLY   Sulfa Antibiotics Rash        Medication List     STOP taking these medications    Jardiance 25 MG Tabs tablet Generic drug: empagliflozin       TAKE these medications    atorvastatin 40 MG tablet Commonly known as: LIPITOR Take 40 mg by mouth daily.   Cholecalciferol 125 MCG (5000 UT) Tabs Take 1 tablet by mouth daily.   glipiZIDE 5 MG 24 hr tablet Commonly known as: GLUCOTROL XL Take 5 mg by mouth 2 (two) times daily.   linagliptin 5 MG Tabs tablet Commonly known as: Tradjenta Take 1 tablet (5 mg total) by mouth daily.   lisinopril  2.5 MG tablet Commonly known as: ZESTRIL  Take 2.5 mg by mouth daily.   Ozempic (  2 MG/DOSE) 8 MG/3ML Sopn Generic drug: Semaglutide (2 MG/DOSE) Inject 2 mg into the skin once a week.   pravastatin 10 MG tablet Commonly known as: PRAVACHOL Take 10 mg by mouth daily.        Discharge Exam: Filed Weights   10/12/24 0932 10/12/24 1529  Weight: 86.2 kg 87.9 kg    Constitutional: Alert, awake, calm, comfortable HEENT: Neck supple Respiratory: Clear to auscultation B/L, no wheezing, no rales.  Cardiovascular: Regular rate and rhythm, no murmurs / rubs / gallops. No extremity edema. 2+ pedal pulses. No carotid bruits.  Abdomen: Soft, no tenderness,  Bowel sounds positive Musculoskeletal: no clubbing / cyanosis. Good ROM, no contractures. Normal muscle tone Skin: no rashes, lesions, ulcers Neurologic: CN 2-12 grossly intact. Sensation intact, No focal deficit identified Psychiatric: Alert and oriented x 3. Normal mood  Condition at discharge: fair  The results of significant diagnostics from this hospitalization (including imaging, microbiology, ancillary and laboratory) are listed below for reference.   Imaging Studies: DG Chest 1 View Result Date: 10/13/2024 EXAM: 1 VIEW XRAY OF THE CHEST 10/13/2024 06:33:05 PM COMPARISON: None available. CLINICAL HISTORY: PNA (pneumonia) FINDINGS: LUNGS AND PLEURA: Low lung volumes. Suspected retrocardiac airspace opacity. No pleural effusion. No pneumothorax. HEART AND MEDIASTINUM: No acute abnormality of the cardiac and mediastinal silhouettes. BONES AND SOFT TISSUES: No acute osseous abnormality. IMPRESSION: 1. Suspected retrocardiac airspace opacity, concerning for pneumonia. Electronically signed by: Pinkie Pebbles MD 10/13/2024 08:00 PM EST RP Workstation: [id number]    Microbiology: Results for orders placed or performed during the hospital encounter of 10/12/24  Respiratory (~20 pathogens) panel by PCR     Status: None   Collection Time: 10/13/24  6:31 PM   Specimen: Nasopharyngeal Swab; Respiratory  Result Value Ref Range Status   Adenovirus NOT DETECTED NOT DETECTED Final   Coronavirus 229E NOT DETECTED NOT DETECTED Final    Comment: (NOTE) The Coronavirus on the Respiratory Panel, DOES NOT test for the novel  Coronavirus (2019 nCoV)    Coronavirus HKU1 NOT DETECTED NOT DETECTED Final   Coronavirus NL63 NOT DETECTED NOT DETECTED Final   Coronavirus OC43 NOT DETECTED NOT DETECTED Final   Metapneumovirus NOT DETECTED NOT DETECTED Final   Rhinovirus / Enterovirus NOT DETECTED NOT DETECTED Final   Influenza A NOT DETECTED NOT DETECTED Final   Influenza B NOT DETECTED NOT DETECTED  Final   Parainfluenza Virus 1 NOT DETECTED NOT DETECTED Final   Parainfluenza Virus 2 NOT DETECTED NOT DETECTED Final   Parainfluenza Virus 3 NOT DETECTED NOT DETECTED Final   Parainfluenza Virus 4 NOT DETECTED NOT DETECTED Final   Respiratory Syncytial Virus NOT DETECTED NOT DETECTED Final   Bordetella pertussis NOT DETECTED NOT DETECTED Final   Bordetella Parapertussis NOT DETECTED NOT DETECTED Final   Chlamydophila pneumoniae NOT DETECTED NOT DETECTED Final   Mycoplasma pneumoniae NOT DETECTED NOT DETECTED Final    Comment: Performed at [facility], [address] [place], [place] [address]  Culture, blood (single) w Reflex to ID Panel     Status: None (Preliminary result)   Collection Time: 10/13/24 11:54 PM   Specimen: BLOOD  Result Value Ref Range Status   Specimen Description BLOOD BLOOD LEFT FOREARM  Final   Special Requests   Final    BOTTLES DRAWN AEROBIC AND ANAEROBIC Blood Culture adequate volume   Culture   Final    NO GROWTH < 12 HOURS Performed at [facility], [address] [place], [place] [address]  Report Status PENDING  Incomplete    Labs: CBC: Recent Labs  Lab 10/12/24 0941 10/13/24 1349 10/14/24 0401  WBC 13.8* 11.6* 9.1  NEUTROABS 11.3*  --   --   HGB 15.8* 13.8 11.7*  HCT 51.7* 40.1 34.3*  MCV 97.9 88.9 87.9  PLT 343 277 272   Basic Metabolic Panel: Recent Labs  Lab 10/13/24 0903 10/13/24 1349 10/13/24 1915 10/13/24 2354 10/14/24 0401  NA 140 142 143 143 141  K 3.9 3.8 4.0 3.1* 3.6  CL 111 111 109 111 108  CO2 16* 16* 18* 18* 19*  GLUCOSE 147* 164* 138* 146* 126*  BUN 15 13 11 11 10   CREATININE 0.66 0.63 0.64 0.56 0.51  CALCIUM 9.3 9.3 8.7* 8.8* 8.4*  MG  --  2.0  --   --  1.9  PHOS  --  1.4*  --   --  1.9*   Liver Function Tests: Recent Labs  Lab 10/12/24 0941  AST 28  ALT 26  ALKPHOS 87  BILITOT 0.3  PROT 6.9  ALBUMIN 4.0   CBG: Recent Labs  Lab 10/14/24 0550 10/14/24 0655 10/14/24 0747  10/14/24 0944 10/14/24 1150  GLUCAP 130* 142* 137* 136* 128*    Discharge time spent: greater than 30 minutes.  Signed: Laree Lock, MD [facility] 10/14/2024

## 2024-10-19 LAB — CULTURE, BLOOD (SINGLE)
Culture: NO GROWTH
Special Requests: ADEQUATE
